# Patient Record
Sex: Female | Born: 1940 | Race: White | Hispanic: No | Marital: Single | Smoking: Never smoker
Health system: Southern US, Community
[De-identification: ages and names within clinical notes are randomized; demographics above are authoritative.]

## PROBLEM LIST (undated history)

## (undated) DIAGNOSIS — I10 Essential (primary) hypertension: Secondary | ICD-10-CM

## (undated) DIAGNOSIS — E039 Hypothyroidism, unspecified: Secondary | ICD-10-CM

## (undated) HISTORY — DX: Essential (primary) hypertension: I10

## (undated) HISTORY — PX: CATARACT EXTRACTION: SUR2

## (undated) HISTORY — PX: APPENDECTOMY: SHX54

## (undated) HISTORY — PX: BACK SURGERY: SHX140

## (undated) HISTORY — PX: TONSILECTOMY, ADENOIDECTOMY, BILATERAL MYRINGOTOMY AND TUBES: SHX2538

---

## 1999-07-21 ENCOUNTER — Other Ambulatory Visit: Admission: RE | Admit: 1999-07-21 | Discharge: 1999-07-21 | Payer: Self-pay | Admitting: *Deleted

## 2000-07-31 ENCOUNTER — Other Ambulatory Visit: Admission: RE | Admit: 2000-07-31 | Discharge: 2000-07-31 | Payer: Self-pay | Admitting: *Deleted

## 2001-08-01 ENCOUNTER — Other Ambulatory Visit: Admission: RE | Admit: 2001-08-01 | Discharge: 2001-08-01 | Payer: Self-pay | Admitting: *Deleted

## 2002-08-10 ENCOUNTER — Other Ambulatory Visit: Admission: RE | Admit: 2002-08-10 | Discharge: 2002-08-10 | Payer: Self-pay | Admitting: *Deleted

## 2003-09-24 ENCOUNTER — Other Ambulatory Visit: Admission: RE | Admit: 2003-09-24 | Discharge: 2003-09-24 | Payer: Self-pay | Admitting: Obstetrics and Gynecology

## 2004-10-30 ENCOUNTER — Other Ambulatory Visit: Admission: RE | Admit: 2004-10-30 | Discharge: 2004-10-30 | Payer: Self-pay | Admitting: Obstetrics and Gynecology

## 2007-07-02 ENCOUNTER — Ambulatory Visit (HOSPITAL_COMMUNITY): Admission: RE | Admit: 2007-07-02 | Discharge: 2007-07-02 | Payer: Self-pay | Admitting: Obstetrics and Gynecology

## 2007-07-17 ENCOUNTER — Encounter: Admission: RE | Admit: 2007-07-17 | Discharge: 2007-07-17 | Payer: Self-pay | Admitting: Obstetrics and Gynecology

## 2007-11-18 ENCOUNTER — Inpatient Hospital Stay (HOSPITAL_COMMUNITY): Admission: RE | Admit: 2007-11-18 | Discharge: 2007-11-19 | Payer: Self-pay | Admitting: Neurological Surgery

## 2010-06-27 NOTE — H&P (Signed)
Christine Harrell, Christine Harrell                 ACCOUNT NO.:  [id number]   MEDICAL RECORD NO.:  [id number]           PATIENT TYPE:   LOCATION:                                 FACILITY:   PHYSICIAN:  Duke Salvia. Marcelle Overlie, M.D.DATE OF BIRTH:  Jun 06, 1940   DATE OF ADMISSION:  DATE OF DISCHARGE:                              HISTORY & PHYSICAL   DATE OF SURGERY:  Jul 02, 2007, at [facility].   CHIEF COMPLAINT:  Stress urinary incontinence.   HISTORY OF PRESENT ILLNESS:  A 70 year old postmenopausal patient, who  presents with classic symptoms of SUI.  Evaluation in our office  starting last fall for incontinence symptoms revealed that she had been  on oxybutynin from her local gynecologist at home, but failed to have  significant improvement in her incontinence episodes.  In March 2009,  she had urodynamics in our office.  PVR was normal at 2 mL.  LPP ranged  from 68-114 with an MUCP of 44 and 43.  She did leak at full capacity  and no uninhibited contractions were noted.  She presents now for TOT of  this procedure including risk of bleeding, infection, adjacent organ  injury, the possible need for catheterization, the small percent chance  of mesh erosion were all reviewed with her, which she understands and  accepts.   PAST MEDICAL HISTORY:  Her current medications include Levoxyl,  diltiazem 240 mg daily, citalopram 10 mg daily, omeprazole 20 mg daily,  oxybutynin 2 mg daily, Veramyst, and Estrace vaginal cream as needed  along with vitamins and calcium supplements.  She has had two vaginal  deliveries in 1964 and 1961.   ALLERGIES:  None   REVIEW OF SYSTEMS:  Significant for history of low thyroid and  hypertension.   PHYSICAL EXAMINATION:  VITAL SIGNS:  Blood pressure 130/70 and afebrile.  HEENT:  Unremarkable.  NECK:  Supple without masses.  LUNGS:  Clear.  CARDIOVASCULAR:  Regular rate and rhythm without murmurs, rubs, or  gallops.  BREASTS:  Without masses.  ABDOMEN:  Soft,  flat, and nontender.  PELVIC:  Normal external genitalia.  Vagina and cervix clear.  Uterus  mid-positional size.  Adnexa negative.  EXTREMITIES/NEUROLOGIC:  Unremarkable.   IMPRESSION:  Stress urinary incontinence   PLAN:  TOT, the procedure and risks were reviewed as above.      Richard M. Marcelle Overlie, M.D.  Electronically Signed     RMH/MEDQ  D:  06/30/2007  T:  07/01/2007  Job:  [id number]

## 2010-06-27 NOTE — Op Note (Signed)
Christine Harrell, Christine Harrell                 ACCOUNT NO.:  [id number]   MEDICAL RECORD NO.:  [id number]          PATIENT TYPE:  INP   LOCATION:  3534                         FACILITY:  [facility]   PHYSICIAN:  Stefani Dama, M.D.  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   OPERATION:  Microdiskectomy L5-S1 left with operating microscope  microdissection technique.   SURGEON:  Stefani Dama, MD   FIRST ASSISTANT:  Hilda Lias, MD   ANESTHESIA:  General endotracheal.   INDICATIONS:  Christine Harrell is a 70 year old individual who has had  significant problems with back and left lower extremity pain.  He has  had an MRI that demonstrates herniated nucleus pulposus at L5-S1 and  having gone through a period of time where things have not been  improving despite efforts at conservative management.  She has been  advised regarding surgical intervention for this process.   PROCEDURE:  The patient was brought to the operating room supine on the  stretcher.  She was placed under general endotracheal anesthesia, then  turned prone.  The back was prepped with alcohol and DuraPrep and draped  in sterile fashion.  Midline incision was created in the lower lumbar  spine after localizing L5-S1 space with the needle and radiography and  then interspinous fascia and tissue was dissected from L5-S1.  Self-  retaining retractor was placed in the wound.  The lamina of L5 was then  cleaned and partially removed out to the medial wall of the facet and  this was done with a high-speed drill and 2 and 3-mm Kerrison punch.  The yellow ligament was taken up and then the dura was identified.  Just  under the region of the disk space, it  was noted to be a significant  elevation at the point which the S1 nerve root had a takeoff.  This was  found to be some fragments of disk material and when dissected with the  operating microscope and high magnification, there was found to be a  fragment of disk in this region.  This fragment was removed and then  several others were palpated and freed with dissector and ultimately  significant quantity of disk material was removed from the region just  under the disk space.  Once this was accomplished, then the nerve root  was noted to be quite free and clear; however, further exploration in  this region identified a communication with the disk space itself.  The  opening yielded some small fragments of disk and to better evacuate this  space, remnants of disk were incised with #15 blade and the disk space  was then explored using a series of curettes and rongeurs to evacuate  the contents of the disk space until no further fragmentatious material  could be found.  In the end, the area  was irrigated multiple times until  clear and no further fragments of disk were  found.  With this the microscope was removed.  Lumbodorsal fascia was  closed with #1 Vicryl in interrupted fashion, 2-0 Vicryl was used in the  subcutaneous tissues, 3-0 Vicryl was used subcuticularly.  Dry sterile  dressing was placed in the skin.  The patient tolerated the procedure  well.  Blood loss estimated was less than 50 mL.      Stefani Dama, M.D.  Electronically Signed     HJE/MEDQ  D:  11/18/2007  T:  11/19/2007  Job:  [id number]

## 2010-06-27 NOTE — Op Note (Signed)
NAMEARANDA, BIHM                 ACCOUNT NO.:  [id number]   MEDICAL RECORD NO.:  [id number]          PATIENT TYPE:  AMB   LOCATION:  SDC                           FACILITY:  [facility]   PHYSICIAN:  Duke Salvia. Marcelle Overlie, M.D.DATE OF BIRTH:  Feb 08, 1941   DATE OF PROCEDURE:  07/02/2007  DATE OF DISCHARGE:                               OPERATIVE REPORT   PREOPERATIVE DIAGNOSIS:  Stress urinary incontinence.   POSTOPERATIVE DIAGNOSIS:  Stress urinary incontinence.   PROCEDURE:  1. Transobturator tape sling.  2. Cystoscopy.   SURGEON:  Duke Salvia. Marcelle Overlie, MD   ANESTHESIA:  General endotracheal.   COMPLICATIONS:  None.   DRAINS:  Foley catheter.   BLOOD LOSS:  Minimal.   PROCEDURE AND FINDINGS:  The patient taken to the operating room after  an adequate level of general endotracheal anesthesia was obtained with  the patient's legs in stirrups.  The perineum and vagina were prepped  and draped with Betadine.  The bladder was drained with Foley catheter.  The mid-urethral area was infiltrated with 1:200,000 solution and 1%  Xylocaine.  The transobturator location was then marked by palpation at  the level of the clitoris just at the border of the inferior ramus of  the symphysis near the margin near the insertion of the adductor longus  muscle.  This was marked on each side and likewise infiltrated with  dilute Xylocaine.  A 2.5 cm vertical incision was made mid urethrally  extended minimally with sharp and blunt dissection.  The surgeon's  finger was then used to dissect until the posterior portion of the  inferior pubic ramus could be palpated on either side.  The Obtryx  transobturator halo needle was then used to make passes on either side.  Once was the needles were positioned, carefully checked to make sure  there was no buttonholing.  Catheter was removed at that point and  cystoscopy was carried out.  There was no evidence of any needle injury  to the bladder and ureteral jets  could be observed.  The scope was  removed and the bladder was deflated with catheter.  The mesh assembly  was pulled  through, appropriate tensioning was achieved, and the mesh trimmed at  the exit sites.  Dermabond used at the exit sites.  The vaginal incision  closed with interrupted 2-0 Vicryl sutures.  Excellent hemostasis at  that point.  She went to recovery room in good condition.  Clear urine  noted.      Richard M. Marcelle Overlie, M.D.  Electronically Signed     RMH/MEDQ  D:  07/02/2007  T:  07/03/2007  Job:  [id number]

## 2010-11-08 LAB — COMPREHENSIVE METABOLIC PANEL
ALT: 24
AST: 22
Albumin: 3.6
Alkaline Phosphatase: 91
BUN: 16
GFR calc non Af Amer: >60
Glucose, Bld: 78
Potassium: 4
Total Bilirubin: 0.3
Total Protein: 6.6

## 2010-11-08 LAB — CBC
MCHC: 34.2
RBC: 4.64
WBC: 6.7

## 2010-11-14 LAB — CBC
HCT: 36.6
Hemoglobin: 12.5
MCV: 90.4
Platelets: 424 — ABNORMAL HIGH
RBC: 4.05
WBC: 7.1

## 2010-11-14 LAB — BASIC METABOLIC PANEL
Calcium: 8.9
Chloride: 105
GFR calc non Af Amer: >60
Glucose, Bld: 76
Sodium: 141

## 2010-11-21 DIAGNOSIS — K3580 Unspecified acute appendicitis: Secondary | ICD-10-CM

## 2010-11-21 DIAGNOSIS — E039 Hypothyroidism, unspecified: Secondary | ICD-10-CM | POA: Insufficient documentation

## 2010-11-21 DIAGNOSIS — I10 Essential (primary) hypertension: Secondary | ICD-10-CM | POA: Insufficient documentation

## 2010-11-21 HISTORY — DX: Unspecified acute appendicitis: K35.80

## 2013-12-16 DIAGNOSIS — N3281 Overactive bladder: Secondary | ICD-10-CM | POA: Insufficient documentation

## 2013-12-16 DIAGNOSIS — E785 Hyperlipidemia, unspecified: Secondary | ICD-10-CM | POA: Insufficient documentation

## 2014-01-13 DIAGNOSIS — J3801 Paralysis of vocal cords and larynx, unilateral: Secondary | ICD-10-CM | POA: Insufficient documentation

## 2014-06-10 DIAGNOSIS — J309 Allergic rhinitis, unspecified: Secondary | ICD-10-CM | POA: Insufficient documentation

## 2015-01-05 ENCOUNTER — Other Ambulatory Visit: Payer: Self-pay | Admitting: Obstetrics and Gynecology

## 2015-01-05 DIAGNOSIS — R928 Other abnormal and inconclusive findings on diagnostic imaging of breast: Secondary | ICD-10-CM

## 2015-01-13 ENCOUNTER — Ambulatory Visit
Admission: RE | Admit: 2015-01-13 | Discharge: 2015-01-13 | Disposition: A | Discharge status: Home / Self Care | Payer: Medicare Other | Source: Ambulatory Visit | Attending: Obstetrics and Gynecology | Admitting: Obstetrics and Gynecology

## 2015-01-13 DIAGNOSIS — R928 Other abnormal and inconclusive findings on diagnostic imaging of breast: Secondary | ICD-10-CM

## 2015-06-24 ENCOUNTER — Other Ambulatory Visit: Payer: Self-pay | Admitting: Obstetrics and Gynecology

## 2015-06-24 DIAGNOSIS — R921 Mammographic calcification found on diagnostic imaging of breast: Secondary | ICD-10-CM

## 2015-07-18 ENCOUNTER — Other Ambulatory Visit: Payer: Self-pay | Admitting: Obstetrics and Gynecology

## 2015-07-18 ENCOUNTER — Ambulatory Visit
Admission: RE | Admit: 2015-07-18 | Discharge: 2015-07-18 | Disposition: A | Discharge status: Home / Self Care | Payer: Medicare Other | Source: Ambulatory Visit | Attending: Obstetrics and Gynecology | Admitting: Obstetrics and Gynecology

## 2015-07-18 DIAGNOSIS — R921 Mammographic calcification found on diagnostic imaging of breast: Secondary | ICD-10-CM

## 2018-02-24 DIAGNOSIS — F325 Major depressive disorder, single episode, in full remission: Secondary | ICD-10-CM | POA: Insufficient documentation

## 2018-07-09 DIAGNOSIS — F419 Anxiety disorder, unspecified: Secondary | ICD-10-CM | POA: Insufficient documentation

## 2018-07-14 ENCOUNTER — Other Ambulatory Visit: Payer: Self-pay | Admitting: Obstetrics and Gynecology

## 2018-07-14 DIAGNOSIS — R928 Other abnormal and inconclusive findings on diagnostic imaging of breast: Secondary | ICD-10-CM

## 2018-07-21 ENCOUNTER — Other Ambulatory Visit: Payer: Self-pay

## 2018-07-21 ENCOUNTER — Ambulatory Visit
Admission: RE | Admit: 2018-07-21 | Discharge: 2018-07-21 | Disposition: A | Discharge status: Home / Self Care | Payer: Medicare Other | Source: Ambulatory Visit | Attending: Obstetrics and Gynecology | Admitting: Obstetrics and Gynecology

## 2018-07-21 DIAGNOSIS — R928 Other abnormal and inconclusive findings on diagnostic imaging of breast: Secondary | ICD-10-CM

## 2020-04-26 ENCOUNTER — Encounter (INDEPENDENT_AMBULATORY_CARE_PROVIDER_SITE_OTHER): Payer: Self-pay | Admitting: Ophthalmology

## 2020-04-26 ENCOUNTER — Ambulatory Visit (INDEPENDENT_AMBULATORY_CARE_PROVIDER_SITE_OTHER): Payer: Medicare PPO | Admitting: Ophthalmology

## 2020-04-26 ENCOUNTER — Other Ambulatory Visit: Payer: Self-pay

## 2020-04-26 DIAGNOSIS — I10 Essential (primary) hypertension: Secondary | ICD-10-CM

## 2020-04-26 DIAGNOSIS — Z961 Presence of intraocular lens: Secondary | ICD-10-CM

## 2020-04-26 DIAGNOSIS — H35033 Hypertensive retinopathy, bilateral: Secondary | ICD-10-CM | POA: Diagnosis not present

## 2020-04-26 DIAGNOSIS — H43811 Vitreous degeneration, right eye: Secondary | ICD-10-CM | POA: Diagnosis not present

## 2020-04-26 DIAGNOSIS — H3581 Retinal edema: Secondary | ICD-10-CM | POA: Diagnosis not present

## 2020-04-26 NOTE — Progress Notes (Signed)
[facility] Note  04/26/2020     CHIEF COMPLAINT Patient presents for Retina Evaluation   HISTORY OF PRESENT ILLNESS: Christine Harrell is a 80 y.o. female who presents to the clinic today for:   HPI    Retina Evaluation    In both eyes.  This started 3 days ago.  Associated Symptoms Flashes and Floaters.  I, the attending physician,  performed the HPI with the patient and updated documentation appropriately.          Comments    Patient here for retina evaluation. Referred by Dr Midge Aver. Patient states vision still has floaters. More like bubble now. No eye pain. Dr. Katy Fitch didn't see any tears, but thought there was a clot. Over weekend kept seeing flashes even last night. Doesn't see flashes during when eye are closed.        Last edited by Bernarda Caffey, MD on 04/27/2020  1:13 PM. (History)    pt is here on the referral of Dr. Shirleen Schirmer for concern of PVD / retinal tear, pt states Friday she started to notice several fol in her right eye temporal vision, she states they lasted all weekend, and then on Sunday she noticed a bunch of new floaters in her vision, pt states the fol have decreased in frequency, but she is still seeing the floaters, pt has had cataract sx OU with Dr. Shirleen Schirmer  Referring physician: Warden Fillers, MD [address] [place],  [place] [phone]  HISTORICAL INFORMATION:   Selected notes from the MEDICAL RECORD NUMBER Referred by Dr. Shirleen Schirmer for concern of PVD / retinal tear LEE:  Ocular Hx- PMH-    CURRENT MEDICATIONS: No current outpatient medications on file. (Ophthalmic Drugs)   No current facility-administered medications for this visit. (Ophthalmic Drugs)   No current outpatient medications on file. (Other)   No current facility-administered medications for this visit. (Other)      REVIEW OF SYSTEMS: ROS    Positive for: Eyes   Negative for: Constitutional, Gastrointestinal, Neurological, Skin,  Genitourinary, Musculoskeletal, HENT, Endocrine, Cardiovascular, Respiratory, Psychiatric, Allergic/Imm, Heme/Lymph   Last edited by Matthew Folks, COA on 04/26/2020 10:18 AM. (History)       ALLERGIES Not on File  PAST MEDICAL HISTORY History reviewed. No pertinent past medical history. Past Surgical History:  Procedure Laterality Date   CATARACT EXTRACTION      FAMILY HISTORY Family History  Problem Relation Age of Onset   Breast cancer Mother     SOCIAL HISTORY         OPHTHALMIC EXAM:  Base Eye Exam    Visual Acuity (Snellen - Linear)      Right Left   Dist [place] 20/30 -2 20/20 -2   Dist ph [place] 20/25        Tonometry (Tonopen, 10:10 AM)      Right Left   Pressure 15 15       Pupils      Dark Light Shape React APD   Right 3 2 Round Brisk None   Left 3 2 Round Brisk None       Visual Fields (Counting fingers)      Left Right    Full Full       Extraocular Movement      Right Left    Full, Ortho Full, Ortho       Neuro/Psych    Oriented x3: Yes   Mood/Affect:  Normal       Dilation    Both eyes: 1.0% Mydriacyl, 2.5% Phenylephrine @ 10:10 AM        Slit Lamp and Fundus Exam    Slit Lamp Exam      Right Left   Lids/Lashes Dermatochalasis - upper lid, mild MGD Dermatochalasis - upper lid, mild MGD   Conjunctiva/Sclera White and quiet White and quiet   Cornea arcus, 2+ Punctate epithelial erosions arcus, 2+ inferior Punctate epithelial erosions   Anterior Chamber deep, narrow temporal angle, no cell deep, narrow temporal angle, no cell   Iris Round and dilated Round and dilated   Lens PC IOL in good position PC IOL in good position   Vitreous Vitreous syneresis, no pigment, Posterior vitreous detachment, blood stained vitreous condensations centrally Vitreous syneresis       Fundus Exam      Right Left   Disc Pink and Sharp, Compact, mild PPA Pink and Sharp, Compact   C/D Ratio 0.2 0.3   Macula Flat, Blunted foveal reflex, mild Drusen  along ST arcades, No heme or edema Flat, Blunted foveal reflex, mild non-central Drusen, No heme or edema   Vessels attenuated, Tortuous attenuated, Tortuous   Periphery Attached, mild midzonal and peripheral drusen, mild pre-retinal heme settled inferiorly, mild peripheral cystoid degeneration, No RT/RD on 360 scleral depression  Attached, mild midzonal and peripheral drusen, mild pre-retinal heme settled inferiorly         Refraction    Manifest Refraction      Sphere Cylinder Dist [place]   Right [place] Sphere 20/25   Left [place] Sphere 20/20          IMAGING AND PROCEDURES  Imaging and Procedures for 04/26/2020  OCT, Retina - OU - Both Eyes       Right Eye Quality was good. Central Foveal Thickness: 259. Progression has no prior data. Findings include normal foveal contour, no IRF, no SRF (+vitreous opacities).   Left Eye Quality was good. Central Foveal Thickness: 251. Progression has no prior data. Findings include normal foveal contour, no IRF, no SRF.   Notes *Images captured and stored on drive  Diagnosis / Impression:  NFP, no IRF/SRF OU OD w/ vitreous opacities  Clinical management:  See below  Abbreviations: NFP - Normal foveal profile. CME - cystoid macular edema. PED - pigment epithelial detachment. IRF - intraretinal fluid. SRF - subretinal fluid. EZ - ellipsoid zone. ERM - epiretinal membrane. ORA - outer retinal atrophy. ORT - outer retinal tubulation. SRHM - subretinal hyper-reflective material. IRHM - intraretinal hyper-reflective material                 ASSESSMENT/PLAN:    ICD-10-CM   1. Posterior vitreous detachment of right eye  H43.811   2. Retinal edema  H35.81 OCT, Retina - OU - Both Eyes  3. Essential hypertension  I10   4. Hypertensive retinopathy of both eyes  H35.033   5. Pseudophakia, both eyes  Z96.1     1,2. Hemorrhagic PVD OD  - Onset Friday, 3.11.22, w/ flashes and floaters  - presented to Dr. Shirleen Schirmer  on 3.14.22  - symptoms  decreasing and improving  - Discussed findings and prognosis  - No RT or RD on 360 scleral depressed exam  - Reviewed s/s of RT/RD  - Strict return precautions for any such RT/RD signs/symptoms  - VH precautions reviewed -- minimize activities, keep head elevated, avoid ASA/NSAIDs/blood thinners as able  - F/U 4 weeks, sooner  prn  3,4. Hypertensive retinopathy OU - discussed importance of tight BP control - monitor  5. Pseudophakia OU  - s/p CE/IOL (Dr. Shirleen Schirmer)  - IOLs in good position, doing well  - monitor    Ophthalmic Meds Ordered this visit:  No orders of the defined types were placed in this encounter.      Return in about 4 weeks (around 05/24/2020) for f/u hemorrhagic PVD OD, DFE, OCT.  There are no Patient Instructions on file for this visit.   Explained the diagnoses, plan, and follow up with the patient and they expressed understanding.  Patient expressed understanding of the importance of proper follow up care.   This document serves as a record of services personally performed by Gardiner Sleeper, MD, PhD. It was created on their behalf by San Jetty. Owens Shark, OA an ophthalmic technician. The creation of this record is the provider's dictation and/or activities during the visit.    Electronically signed by: San Jetty. Owens Shark, [place] 03.15.2022 1:18 PM   Gardiner Sleeper, M.D., Ph.D. Diseases & Surgery of the Retina and Vitreous [facility]  I have reviewed the above documentation for accuracy and completeness, and I agree with the above. Gardiner Sleeper, M.D., Ph.D. 04/27/20 1:18 PM  Abbreviations: M myopia (nearsighted); A astigmatism; H hyperopia (farsighted); P presbyopia; Mrx spectacle prescription;  CTL contact lenses; OD right eye; OS left eye; OU both eyes  XT exotropia; ET esotropia; PEK punctate epithelial keratitis; PEE punctate epithelial erosions; DES dry eye syndrome; MGD meibomian gland dysfunction; ATs artificial tears; PFAT's  preservative free artificial tears; [place] nuclear sclerotic cataract; PSC posterior subcapsular cataract; ERM epi-retinal membrane; PVD posterior vitreous detachment; RD retinal detachment; DM diabetes mellitus; DR diabetic retinopathy; NPDR non-proliferative diabetic retinopathy; PDR proliferative diabetic retinopathy; CSME clinically significant macular edema; DME diabetic macular edema; dbh dot blot hemorrhages; CWS cotton wool spot; POAG primary open angle glaucoma; C/D cup-to-disc ratio; HVF humphrey visual field; GVF goldmann visual field; OCT optical coherence tomography; IOP intraocular pressure; BRVO Branch retinal vein occlusion; CRVO central retinal vein occlusion; CRAO central retinal artery occlusion; BRAO branch retinal artery occlusion; RT retinal tear; SB scleral buckle; PPV pars plana vitrectomy; VH Vitreous hemorrhage; PRP panretinal laser photocoagulation; IVK intravitreal kenalog; VMT vitreomacular traction; MH Macular hole;  NVD neovascularization of the disc; NVE neovascularization elsewhere; AREDS age related eye disease study; ARMD age related macular degeneration; POAG primary open angle glaucoma; EBMD epithelial/anterior basement membrane dystrophy; ACIOL anterior chamber intraocular lens; IOL intraocular lens; PCIOL posterior chamber intraocular lens; Phaco/IOL phacoemulsification with intraocular lens placement; [place] photorefractive keratectomy; LASIK laser assisted in situ keratomileusis; HTN hypertension; DM diabetes mellitus; COPD chronic obstructive pulmonary disease

## 2020-04-27 ENCOUNTER — Encounter (INDEPENDENT_AMBULATORY_CARE_PROVIDER_SITE_OTHER): Payer: Self-pay | Admitting: Ophthalmology

## 2020-05-20 NOTE — Progress Notes (Signed)
[facility] Note  05/24/2020     CHIEF COMPLAINT Patient presents for Retina Follow Up   HISTORY OF PRESENT ILLNESS: Christine Harrell is a 80 y.o. female who presents to the clinic today for:   HPI    Retina Follow Up    Patient presents with  PVD.  In right eye.  This started 4 weeks ago.  I, the attending physician,  performed the HPI with the patient and updated documentation appropriately.          Comments    Pt here for retinal follow up 4wk hemorrhage PVD OD. Pt still reporting flashes on right side occurring usually late at night, when its darker in the room. Vision in OD is about the same. No reports of ocular discomfort. No floaters/flashes OS.       Last edited by Bernarda Caffey, MD on 05/24/2020  1:08 PM. (History)    pt states she rarely sees any floaters in her right eye anymore, she is still see fol, but usually only at night in a dark room  Referring physician: Warden Fillers, [address] [place],  [place] [id number]  HISTORICAL INFORMATION:   Selected notes from the [place] Referred by Dr. Shirleen Schirmer for concern of PVD / retinal tear   CURRENT MEDICATIONS: No current outpatient medications on file. (Ophthalmic Drugs)   No current facility-administered medications for this visit. (Ophthalmic Drugs)   No current outpatient medications on file. (Other)   No current facility-administered medications for this visit. (Other)      REVIEW OF SYSTEMS: ROS    Positive for: Eyes   Negative for: Constitutional, Gastrointestinal, Neurological, Skin, Genitourinary, Musculoskeletal, HENT, Endocrine, Cardiovascular, Respiratory, Psychiatric, Allergic/Imm, Heme/Lymph   Last edited by Kingsley Spittle, COT on 05/24/2020 10:03 AM. (History)       ALLERGIES Not on File  PAST MEDICAL HISTORY History reviewed. No pertinent past medical history. Past Surgical History:  Procedure Laterality Date  . CATARACT  EXTRACTION      FAMILY HISTORY Family History  Problem Relation Age of Onset  . Breast cancer Mother     SOCIAL HISTORY         OPHTHALMIC EXAM:  Base Eye Exam    Visual Acuity (Snellen - Linear)      Right Left   Dist [place] 20/25 -1 20/25 -1   Dist ph [place] 20/20 -2 20/20 -2       Tonometry (Tonopen, 10:12 AM)      Right Left   Pressure 17 13       Pupils      Dark Light Shape React APD   Right 3 2 Round Brisk None   Left 3 2 Round Brisk None       Visual Fields      Left Right    Full Full       Extraocular Movement      Right Left    Full, Ortho Full, Ortho       Neuro/Psych    Oriented x3: Yes   Mood/Affect: Normal       Dilation    Both eyes: 1.0% Mydriacyl, 2.5% Phenylephrine @ 10:13 AM        Slit Lamp and Fundus Exam    Slit Lamp Exam      Right Left   Lids/Lashes Dermatochalasis - upper lid, mild MGD Dermatochalasis - upper lid, mild MGD   Conjunctiva/Sclera White  and quiet White and quiet   Cornea arcus, 2+ Punctate epithelial erosions, EBMD arcus, 2+ inferior Punctate epithelial erosions   Anterior Chamber deep, narrow temporal angle, no cell deep, narrow temporal angle, no cell   Iris Round and dilated Round and dilated   Lens PC IOL in good position PC IOL in good position   Vitreous Vitreous syneresis, no pigment, Posterior vitreous detachment, blood stained vitreous condensations centrally improved--essentially resolved Vitreous syneresis       Fundus Exam      Right Left   Disc Pink and Sharp, Compact, mild PPA Pink and Sharp, Compact   C/D Ratio 0.2 0.3   Macula Flat, Blunted foveal reflex, mild Drusen along ST arcades, No heme or edema Flat, Blunted foveal reflex, mild non-central Drusen, No heme or edema   Vessels attenuated, Tortuous attenuated, Tortuous   Periphery Attached, mild midzonal and peripheral drusen, mild pre-retinal heme settled inferiorly -- resolved, mild peripheral cystoid degeneration, No RT/RD Attached, mild  midzonal and peripheral drusen          IMAGING AND PROCEDURES  Imaging and Procedures for 05/24/2020  OCT, Retina - OU - Both Eyes       Right Eye Quality was good. Central Foveal Thickness: 253. Progression has improved. Findings include normal foveal contour, no IRF, no SRF (Interval improvement in vitreous opacities).   Left Eye Quality was good. Central Foveal Thickness: 251. Progression has been stable. Findings include normal foveal contour, no IRF, no SRF.   Notes *Images captured and stored on drive  Diagnosis / Impression:  NFP, no IRF/SRF OU OD w/ Interval improvement in vitreous opacities   Clinical management:  See below  Abbreviations: NFP - Normal foveal profile. CME - cystoid macular edema. PED - pigment epithelial detachment. IRF - intraretinal fluid. SRF - subretinal fluid. EZ - ellipsoid zone. ERM - epiretinal membrane. ORA - outer retinal atrophy. ORT - outer retinal tubulation. SRHM - subretinal hyper-reflective material. IRHM - intraretinal hyper-reflective material                 ASSESSMENT/PLAN:    ICD-10-CM   1. Posterior vitreous detachment of right eye  H43.811   2. Retinal edema  H35.81 OCT, Retina - OU - Both Eyes  3. Essential hypertension  I10   4. Hypertensive retinopathy of both eyes  H35.033   5. Pseudophakia, both eyes  Z96.1     1,2. Hemorrhagic PVD OD  - Onset Friday, 3.11.22, w/ flashes and floaters  - presented to Dr. Shirleen Schirmer  on 3.14.22  - interval resolution of floaters; mild persistent photopsias at night  - mild VH now clear  - Discussed findings and prognosis  - No RT or RD on 360 scleral depressed exam  - Reviewed s/s of RT/RD  - Strict return precautions for any such RT/RD signs/symptoms  - pt is cleared from a retina standpoint for release to Dr. Shirleen Schirmer and resumption of primary eye care  - pt can f/u here prn  3,4. Hypertensive retinopathy OU - discussed importance of tight BP control - monitor  5.  Pseudophakia OU  - s/p CE/IOL (Dr. Shirleen Schirmer)  - IOLs in good position, doing well  - monitor   Ophthalmic Meds Ordered this visit:  No orders of the defined types were placed in this encounter.      Return if symptoms worsen or fail to improve.  There are no Patient Instructions on file for this visit.   Explained the  diagnoses, plan, and follow up with the patient and they expressed understanding.  Patient expressed understanding of the importance of proper follow up care.   This document serves as a record of services personally performed by Gardiner Sleeper, MD, PhD. It was created on their behalf by Estill Bakes, COT an ophthalmic technician. The creation of this record is the provider's dictation and/or activities during the visit.    Electronically signed by: Estill Bakes, COT 4.8.22 @ 1:11 PM   This document serves as a record of services personally performed by Gardiner Sleeper, MD, PhD. It was created on their behalf by San Jetty. Owens Shark, OA an ophthalmic technician. The creation of this record is the provider's dictation and/or activities during the visit.    Electronically signed by: San Jetty. Owens Shark, [place] 04.12.2022 1:11 PM  Gardiner Sleeper, M.D., Ph.D. Diseases & Surgery of the Retina and B and E 05/24/2020   I have reviewed the above documentation for accuracy and completeness, and I agree with the above. Gardiner Sleeper, M.D., Ph.D. 05/24/20 1:11 PM  Abbreviations: M myopia (nearsighted); A astigmatism; H hyperopia (farsighted); P presbyopia; Mrx spectacle prescription;  CTL contact lenses; OD right eye; OS left eye; OU both eyes  XT exotropia; ET esotropia; PEK punctate epithelial keratitis; PEE punctate epithelial erosions; DES dry eye syndrome; MGD meibomian gland dysfunction; ATs artificial tears; PFAT's preservative free artificial tears; [place] nuclear sclerotic cataract; PSC posterior subcapsular cataract; ERM epi-retinal membrane;  PVD posterior vitreous detachment; RD retinal detachment; DM diabetes mellitus; DR diabetic retinopathy; NPDR non-proliferative diabetic retinopathy; PDR proliferative diabetic retinopathy; CSME clinically significant macular edema; DME diabetic macular edema; dbh dot blot hemorrhages; CWS cotton wool spot; POAG primary open angle glaucoma; C/D cup-to-disc ratio; HVF humphrey visual field; GVF goldmann visual field; OCT optical coherence tomography; IOP intraocular pressure; BRVO Branch retinal vein occlusion; CRVO central retinal vein occlusion; CRAO central retinal artery occlusion; BRAO branch retinal artery occlusion; RT retinal tear; SB scleral buckle; PPV pars plana vitrectomy; VH Vitreous hemorrhage; PRP panretinal laser photocoagulation; IVK intravitreal kenalog; VMT vitreomacular traction; MH Macular hole;  NVD neovascularization of the disc; NVE neovascularization elsewhere; AREDS age related eye disease study; ARMD age related macular degeneration; POAG primary open angle glaucoma; EBMD epithelial/anterior basement membrane dystrophy; ACIOL anterior chamber intraocular lens; IOL intraocular lens; PCIOL posterior chamber intraocular lens; Phaco/IOL phacoemulsification with intraocular lens placement; [place] photorefractive keratectomy; LASIK laser assisted in situ keratomileusis; HTN hypertension; DM diabetes mellitus; COPD chronic obstructive pulmonary disease

## 2020-05-24 ENCOUNTER — Ambulatory Visit (INDEPENDENT_AMBULATORY_CARE_PROVIDER_SITE_OTHER): Payer: Medicare PPO | Admitting: Ophthalmology

## 2020-05-24 ENCOUNTER — Other Ambulatory Visit: Payer: Self-pay

## 2020-05-24 ENCOUNTER — Encounter (INDEPENDENT_AMBULATORY_CARE_PROVIDER_SITE_OTHER): Payer: Self-pay | Admitting: Ophthalmology

## 2020-05-24 DIAGNOSIS — Z961 Presence of intraocular lens: Secondary | ICD-10-CM

## 2020-05-24 DIAGNOSIS — H43811 Vitreous degeneration, right eye: Secondary | ICD-10-CM | POA: Diagnosis not present

## 2020-05-24 DIAGNOSIS — H3581 Retinal edema: Secondary | ICD-10-CM | POA: Diagnosis not present

## 2020-05-24 DIAGNOSIS — H35033 Hypertensive retinopathy, bilateral: Secondary | ICD-10-CM | POA: Diagnosis not present

## 2020-05-24 DIAGNOSIS — I10 Essential (primary) hypertension: Secondary | ICD-10-CM | POA: Diagnosis not present

## 2020-06-18 IMAGING — MG DIGITAL DIAGNOSTIC UNILATERAL RIGHT MAMMOGRAM WITH TOMO AND CAD
4 series · 4 of 12 positions shown · non-contrast
Comparison: Previous exam(s).
COMPARISON: Previous exam(s).

Addendum:
CLINICAL DATA: Possible mass in the upper-outer right breast on the
recent screening mammogram.

EXAM:
DIGITAL DIAGNOSTIC RIGHT MAMMOGRAM WITH TOMO
ULTRASOUND RIGHT BREAST

[R CC synth-2D]
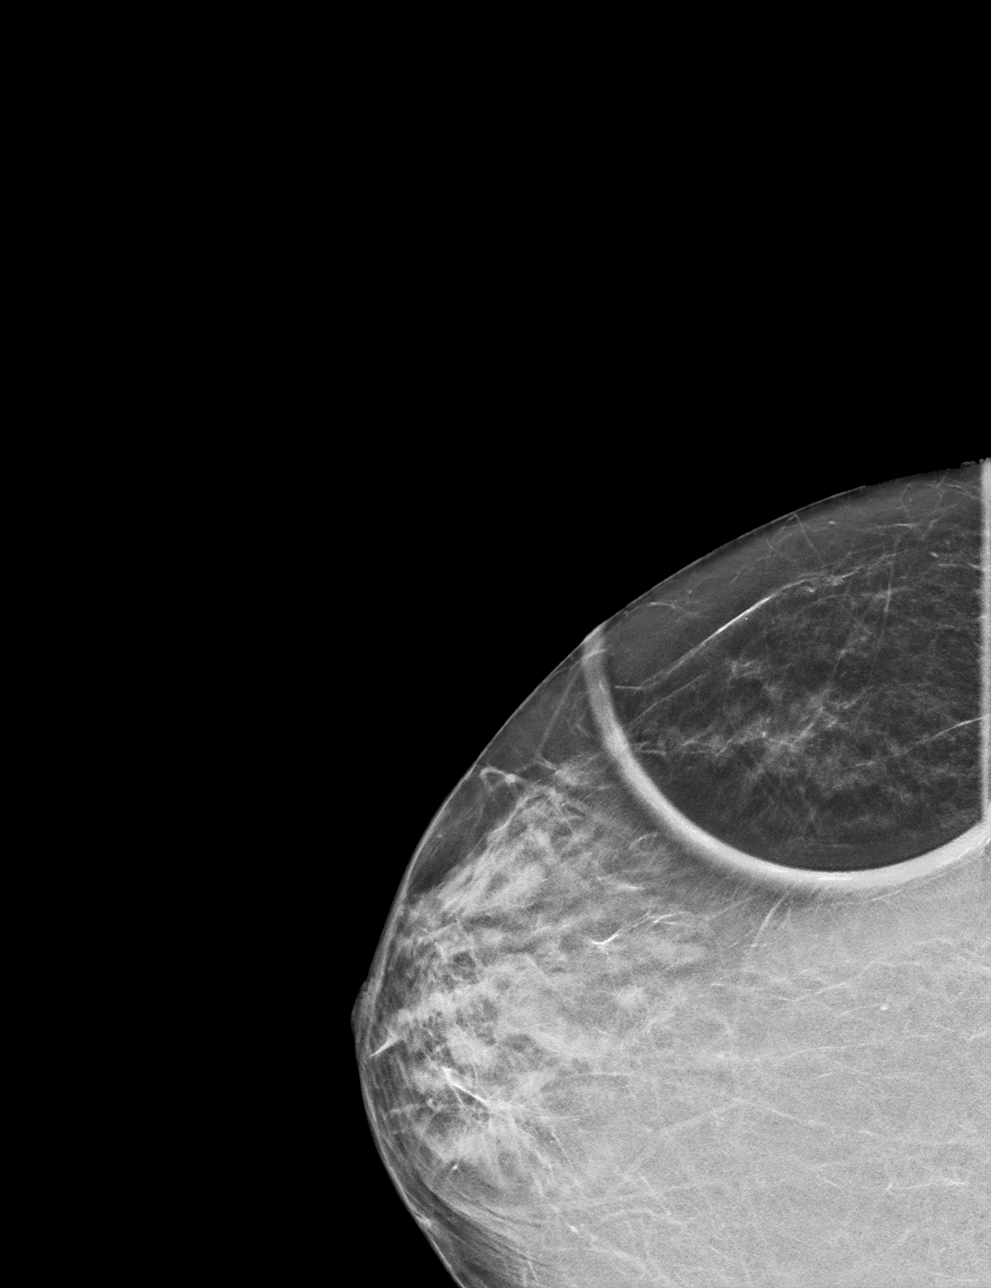

[R MLO synth-2D]
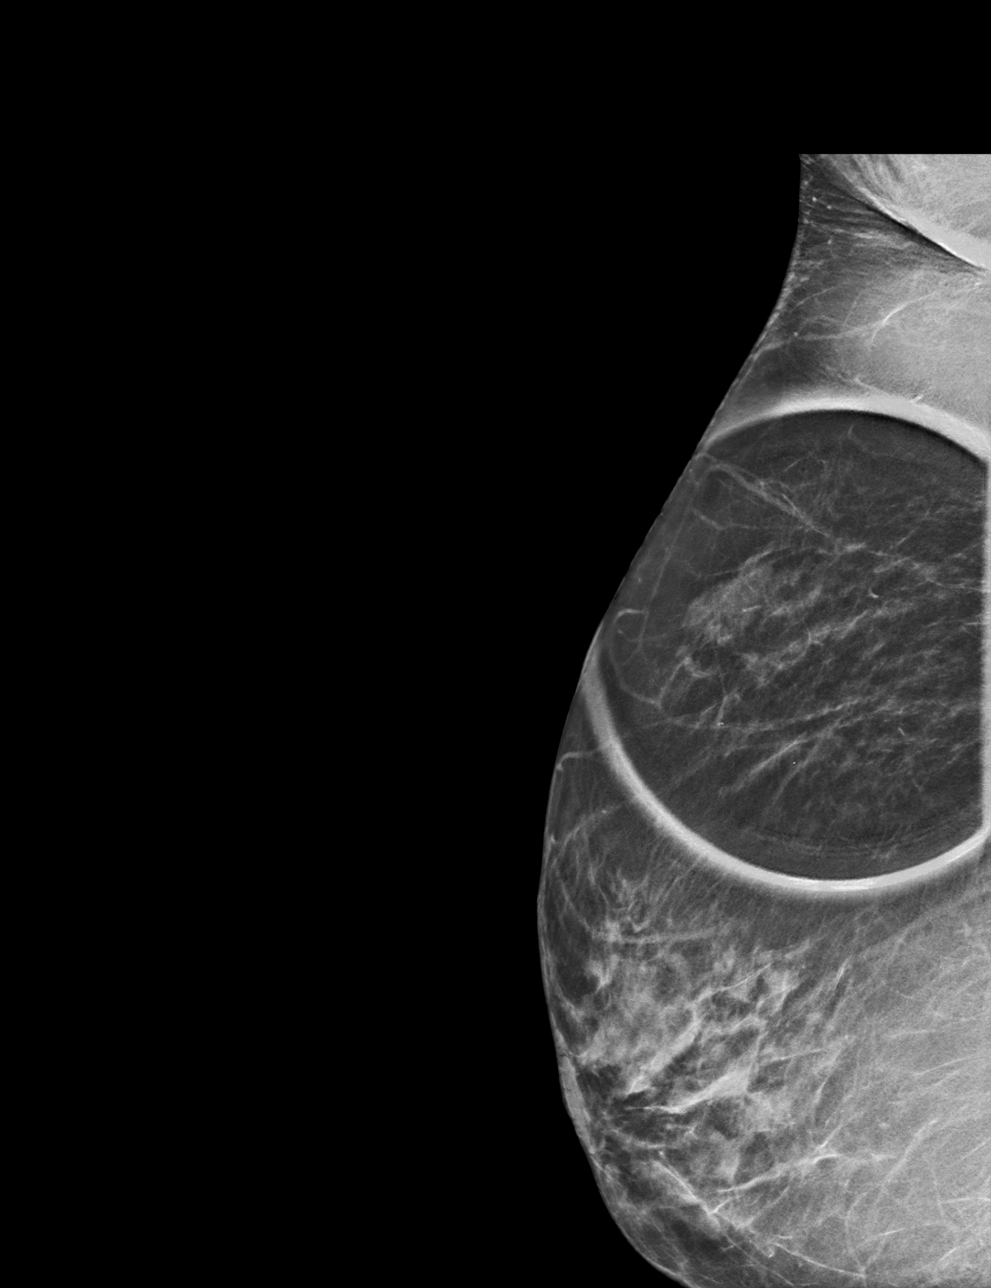

[R MLO tomo · tomo slice 27/52.0]
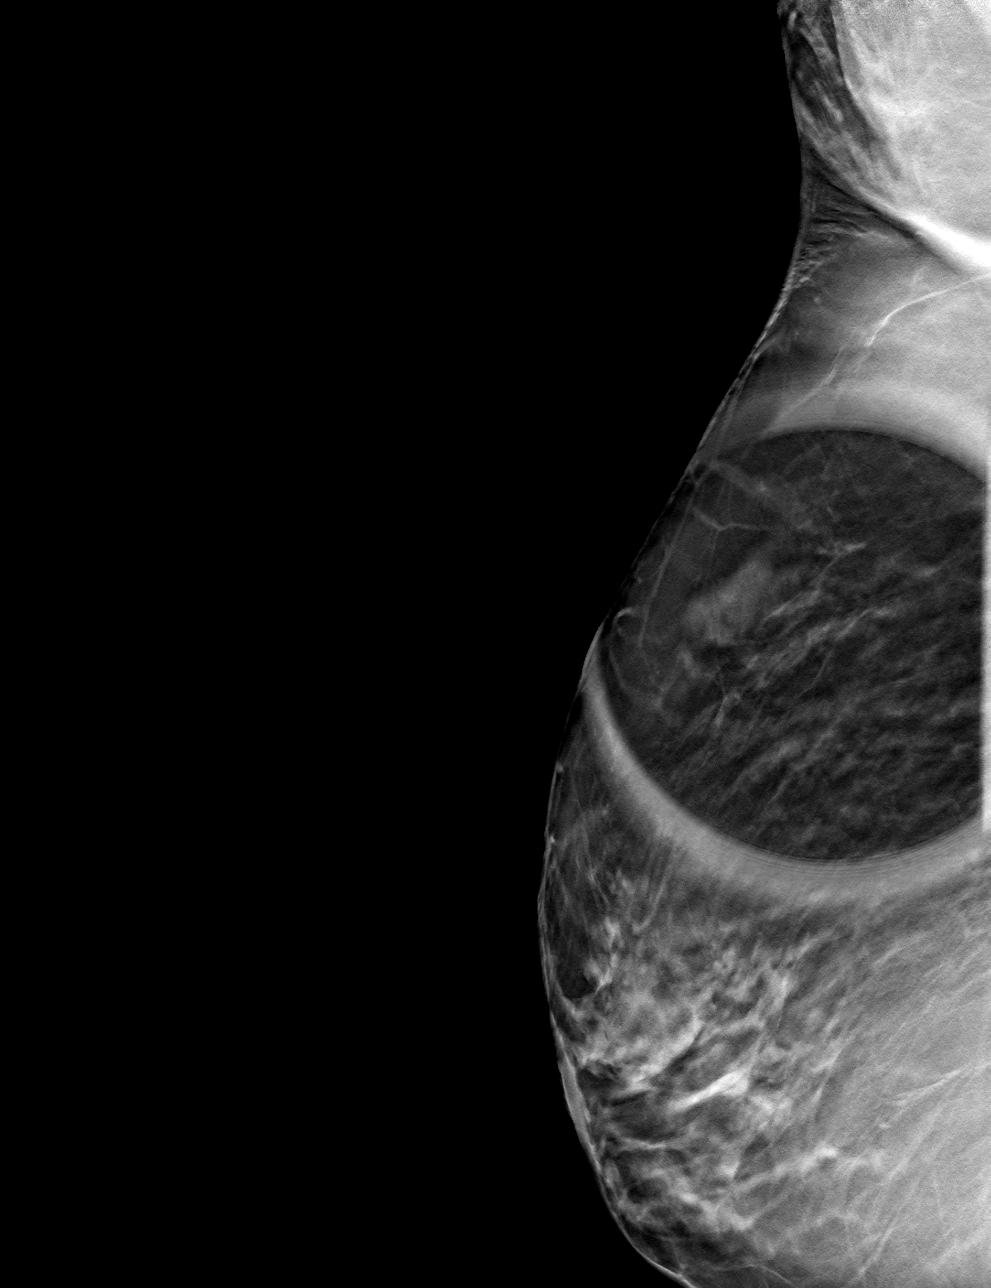

[R CC tomo · tomo slice 23/44.0]
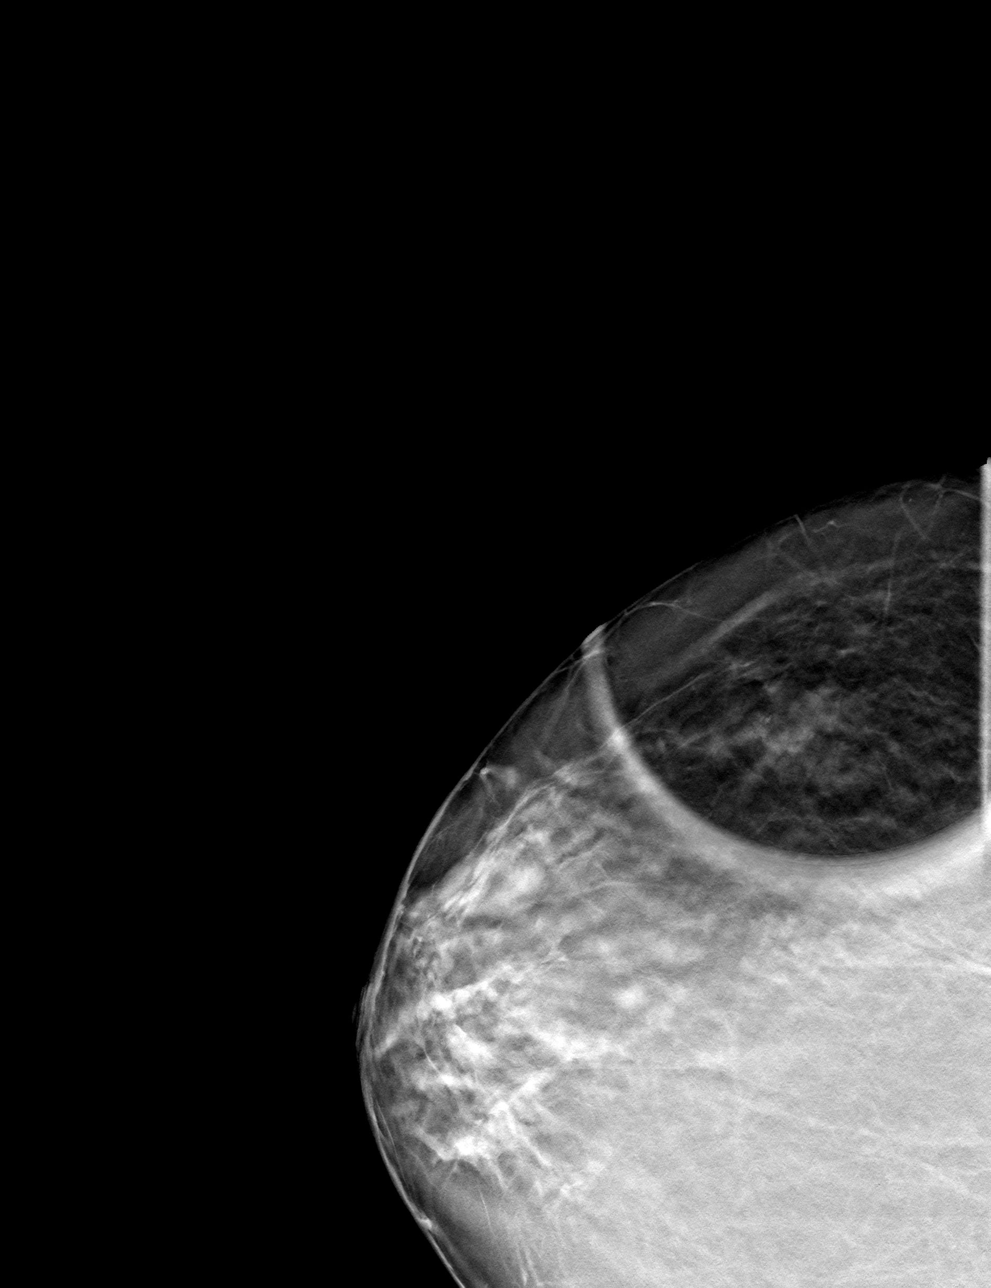

[4 of 12 positions shown; findings below may reference images not displayed]

ACR Breast Density Category b: There are scattered areas of
fibroglandular density.
FINDINGS: 3D tomographic and 2D generated spot compression views of the right
breast demonstrate an oval asymmetry with predominantly indistinct
margins in the upper-outer quadrant at the location of recent
mammographic concern. There is some interspersed fat.

On physical exam, no mass or thickening is palpable in the upper
outer right breast.

Targeted ultrasound is performed, showing an island of
fibroglandular tissue containing a 5 mm cyst in the 10 o'clock
position of the right breast, corresponding to the mammographic
asymmetry. No solid masses or other findings suspicious for
malignancy were seen.
IMPRESSION: Island of benign fibroglandular and fibrocystic tissue in the upper
outer right breast. No evidence of malignancy.

RECOMMENDATION:
Bilateral screening mammogram in year.

I have discussed the findings and recommendations with the patient.
Results were also provided in writing at the conclusion of the
visit. If applicable, a reminder letter will be sent to the patient
regarding the next appointment.

BI-RADS CATEGORY  2: Benign.

ADDENDUM:
A bilateral screening mammogram is recommended in 1 year

*** End of Addendum ***
ACR Breast Density Category b: There are scattered areas of
fibroglandular density.
FINDINGS: 3D tomographic and 2D generated spot compression views of the right
breast demonstrate an oval asymmetry with predominantly indistinct
margins in the upper-outer quadrant at the location of recent
mammographic concern. There is some interspersed fat.

On physical exam, no mass or thickening is palpable in the upper
outer right breast.

Targeted ultrasound is performed, showing an island of
fibroglandular tissue containing a 5 mm cyst in the 10 o'clock
position of the right breast, corresponding to the mammographic
asymmetry. No solid masses or other findings suspicious for
malignancy were seen.
IMPRESSION: Island of benign fibroglandular and fibrocystic tissue in the upper
outer right breast. No evidence of malignancy.

RECOMMENDATION:
Bilateral screening mammogram in year.

I have discussed the findings and recommendations with the patient.
Results were also provided in writing at the conclusion of the
visit. If applicable, a reminder letter will be sent to the patient
regarding the next appointment.

BI-RADS CATEGORY  2: Benign.

## 2020-08-04 DIAGNOSIS — M810 Age-related osteoporosis without current pathological fracture: Secondary | ICD-10-CM | POA: Insufficient documentation

## 2020-08-04 DIAGNOSIS — E669 Obesity, unspecified: Secondary | ICD-10-CM | POA: Insufficient documentation

## 2020-08-04 DIAGNOSIS — M8580 Other specified disorders of bone density and structure, unspecified site: Secondary | ICD-10-CM | POA: Insufficient documentation

## 2021-08-03 ENCOUNTER — Other Ambulatory Visit: Payer: Self-pay | Admitting: Obstetrics and Gynecology

## 2021-08-03 DIAGNOSIS — R928 Other abnormal and inconclusive findings on diagnostic imaging of breast: Secondary | ICD-10-CM

## 2021-08-08 ENCOUNTER — Ambulatory Visit
Admission: RE | Admit: 2021-08-08 | Discharge: 2021-08-08 | Disposition: A | Discharge status: Home / Self Care | Payer: Medicare PPO | Source: Ambulatory Visit | Attending: Obstetrics and Gynecology | Admitting: Obstetrics and Gynecology

## 2021-08-08 ENCOUNTER — Other Ambulatory Visit: Payer: Self-pay | Admitting: Obstetrics and Gynecology

## 2021-08-08 ENCOUNTER — Ambulatory Visit
Admission: RE | Admit: 2021-08-08 | Discharge: 2021-08-08 | Disposition: A | Discharge status: Home / Self Care | Payer: Medicare Other | Source: Ambulatory Visit | Attending: Obstetrics and Gynecology | Admitting: Obstetrics and Gynecology

## 2021-08-08 DIAGNOSIS — R928 Other abnormal and inconclusive findings on diagnostic imaging of breast: Secondary | ICD-10-CM

## 2021-08-08 DIAGNOSIS — N6320 Unspecified lump in the left breast, unspecified quadrant: Secondary | ICD-10-CM

## 2021-08-14 ENCOUNTER — Ambulatory Visit
Admission: RE | Admit: 2021-08-14 | Discharge: 2021-08-14 | Disposition: A | Discharge status: Home / Self Care | Payer: Medicare PPO | Source: Ambulatory Visit | Attending: Obstetrics and Gynecology | Admitting: Obstetrics and Gynecology

## 2021-08-14 DIAGNOSIS — N6320 Unspecified lump in the left breast, unspecified quadrant: Secondary | ICD-10-CM

## 2021-08-17 ENCOUNTER — Telehealth: Payer: Self-pay | Admitting: Hematology and Oncology

## 2021-08-17 NOTE — Telephone Encounter (Signed)
Spoke to patient to confirm afternoon clinic appointment for 7/12, packet sent via mail

## 2021-08-21 ENCOUNTER — Encounter: Payer: Self-pay | Admitting: *Deleted

## 2021-08-21 DIAGNOSIS — Z170 Estrogen receptor positive status [ER+]: Secondary | ICD-10-CM | POA: Insufficient documentation

## 2021-08-22 NOTE — Progress Notes (Signed)
Radiation Oncology         [phone] ________________________________  Multidisciplinary Breast Oncology Clinic [facility]) Initial Outpatient Consultation  Name: Christine Harrell MRN: [id number]  Date: 08/23/2021  DOB: 09-27-1940  NI:[id number], Delsa Grana, MD  Jovita Kussmaul, MD   REFERRING PHYSICIAN: Autumn Messing III, MD  DIAGNOSIS: There were no encounter diagnoses.  Stage *** Left Breast LOQ, Invasive Ductal Carcinoma, ER+ / PR+ / Her2-, Grade 2  No diagnosis found.  HISTORY OF PRESENT ILLNESS::Christine Harrell is a 81 y.o. female who is presenting to the office today for evaluation of her newly diagnosed breast cancer. She is accompanied by ***. She is doing well overall.   She had routine screening mammography on 07/31/21 showing a possible abnormality in the left breast. She underwent left breast diagnostic mammography with tomography and left breast ultrasonography at [facility] on 08/08/21 showing: an indeterminate left breast mass at the 6 o'clock position 1 cmfn, measuring 7 x 3 x 3 mm. No suspicious left axillary lymphadenopathy was appreciated.   Biopsy of the 6 o'clock left breast on 08/14/21 showed: grade 2 invasive ductal/mammary carcinoma with atypical ductal hyperplasia. Prognostic indicators significant for: estrogen receptor, 100% positive and progesterone receptor, 95% positive, both with strong staining intensity. Proliferation marker Ki67 at 10%. HER2 negative.  Menarche: *** years old Age at first live birth: *** years old GP: *** LMP: *** Contraceptive: *** HRT: ***   The patient was referred today for presentation in the multidisciplinary conference.  Radiology studies and pathology slides were presented there for review and discussion of treatment options.  A consensus was discussed regarding potential next steps.  PREVIOUS RADIATION THERAPY: {EXAM; YES/NO:19492::"No"}  PAST MEDICAL HISTORY: No past medical history on file.  PAST SURGICAL HISTORY: Past  Surgical History:  Procedure Laterality Date   CATARACT EXTRACTION      FAMILY HISTORY:  Family History  Problem Relation Age of Onset   Breast cancer Mother     SOCIAL HISTORY:  Social History   Socioeconomic History   Marital status: Single    Spouse name: Not on file   Number of children: Not on file   Years of education: Not on file   Highest education level: Not on file  Occupational History   Not on file  Tobacco Use   Smoking status: Not on file   Smokeless tobacco: Not on file  Substance and Sexual Activity   Alcohol use: Not on file   Drug use: Not on file   Sexual activity: Not on file  Other Topics Concern   Not on file  Social History Narrative   Not on file   Social Determinants of Health   Financial Resource Strain: Not on file  Food Insecurity: Not on file  Transportation Needs: Not on file  Physical Activity: Not on file  Stress: Not on file  Social Connections: Not on file    ALLERGIES: Not on File  MEDICATIONS:  No current outpatient medications on file.   No current facility-administered medications for this encounter.    REVIEW OF SYSTEMS: A 10+ POINT REVIEW OF SYSTEMS WAS OBTAINED including neurology, dermatology, psychiatry, cardiac, respiratory, lymph, extremities, GI, GU, musculoskeletal, constitutional, reproductive, HEENT. On the provided form, she reports ***. She denies *** and any other symptoms.    PHYSICAL EXAM:  vitals were not taken for this visit.  {may need to copy over vitals} Lungs are clear to auscultation bilaterally. Heart has regular rate and rhythm. No palpable  cervical, supraclavicular, or axillary adenopathy. Abdomen soft, non-tender, normal bowel sounds. Breast: *** breast with no palpable mass, nipple discharge, or bleeding. *** breast with ***.   KPS = ***  100 - Normal; no complaints; no evidence of disease. 90   - Able to carry on normal activity; minor signs or symptoms of disease. 80   - Normal activity  with effort; some signs or symptoms of disease. 29   - Cares for self; unable to carry on normal activity or to do active work. 60   - Requires occasional assistance, but is able to care for most of his personal needs. 50   - Requires considerable assistance and frequent medical care. 41   - Disabled; requires special care and assistance. 55   - Severely disabled; hospital admission is indicated although death not imminent. 75   - Very sick; hospital admission necessary; active supportive treatment necessary. 10   - Moribund; fatal processes progressing rapidly. 0     - Dead  Karnofsky DA, Abelmann [place], Craver LS and Burchenal [facility] [phone]) The use of the nitrogen mustards in the palliative treatment of carcinoma: with particular reference to bronchogenic carcinoma Cancer 1 [phone]  LABORATORY DATA:  Lab Results  Component Value Date   WBC 7.1 11/18/2007   HGB 12.5 11/18/2007   HCT 36.6 11/18/2007   MCV 90.4 11/18/2007   PLT 424 (H) 11/18/2007   Lab Results  Component Value Date   NA 141 11/18/2007   K 3.9 11/18/2007   CL 105 11/18/2007   CO2 28 11/18/2007   Lab Results  Component Value Date   ALT 24 07/01/2007   AST 22 07/01/2007   ALKPHOS 91 07/01/2007   BILITOT 0.3 07/01/2007    PULMONARY FUNCTION TEST:   Review Flowsheet        No data to display          RADIOGRAPHY: [place] LT BREAST BX W LOC DEV 1ST LESION IMG BX SPEC US GUIDE  Addendum Date: 08/17/2021   ADDENDUM REPORT: 08/17/2021 15:58 ADDENDUM: Pathology revealed Breast, LEFT, needle core biopsy, 6:00, ribbon clip - GRADE 2 INVASIVE MAMMARY CARCINOMA WITH DUCTAL AND PREDOMINANTLY LOBULAR FEATURES, ATYPICAL DUCTAL HYPERPLASIA. This was found to be concordant by Dr. Nolon Nations. Pathology results were discussed with the patient by telephone. The patient reported doing well after the biopsy with tenderness at the site. Post biopsy instructions and care were reviewed and questions were answered. The patient was  encouraged to call [facility] for any additional concerns. The patient was referred to [facility] on August 23, 2021. Pathology results reported by Stacie Acres RN on 08/17/2021. Electronically Signed   By: Nolon Nations M.D.   On: 08/17/2021 15:58   Result Date: 08/17/2021 CLINICAL DATA:  Patient presents for ultrasound-guided core biopsy of LEFT breast. EXAM: ULTRASOUND GUIDED LEFT BREAST CORE NEEDLE BIOPSY COMPARISON:  None Available. PROCEDURE: I met with the patient and we discussed the procedure of ultrasound-guided biopsy, including benefits and alternatives. We discussed the high likelihood of a successful procedure. We discussed the risks of the procedure, including infection, bleeding, tissue injury, clip migration, and inadequate sampling. Informed written consent was given. The usual time-out protocol was performed immediately prior to the procedure. Lesion quadrant: 6 o'clock LEFT breast Using sterile technique and 1% Lidocaine as local anesthetic, under direct ultrasound visualization, a 12 gauge spring-loaded device was used to perform biopsy of  mass in the 6 o'clock location of the LEFT breast 1 centimeter the nipple using a LATERAL approach. At the conclusion of the procedure ribbon shaped tissue marker clip was deployed into the biopsy cavity. Follow up 2 view mammogram was performed and dictated separately. IMPRESSION: Ultrasound guided biopsy of LEFT breast mass. No apparent complications. Electronically Signed: By: Nolon Nations M.D. On: 08/14/2021 14:15  MM CLIP PLACEMENT LEFT  Result Date: 08/14/2021 CLINICAL DATA:  Status post ultrasound-guided core biopsy of LEFT breast mass. EXAM: 3D DIAGNOSTIC LEFT MAMMOGRAM POST ULTRASOUND BIOPSY COMPARISON:  Previous exam(s). FINDINGS: 3D Mammographic images were obtained following ultrasound guided biopsy of mass in the 6 o'clock location of  the LEFT breast 1 centimeter from the nipple. The biopsy marking clip is in expected position at the site of biopsy. During the postprocedure clip images, patient slowly fell backwards, striking her bottom and softly rolling to strike the back of her head on the floor. Patient reports her legs were very weak. On physical exam, patient had no tenderness in the back of the head. I palpated no mass in this region. Patient reported no pain in the bottom or pelvis , Initially, patient had mild frontal headache after the fall but had no other symptoms. The headache quickly resolved over the next 10 minutes. Patient was discharged from the [facility] in good condition. She was given my card in case there are any changes in her condition. She was encouraged to seek further care in the emergency department as needed IMPRESSION: Appropriate positioning of the ribbon shaped biopsy marking clip at the site of biopsy in the LOWER LEFT breast. Final Assessment: Post Procedure Mammograms for Marker Placement Electronically Signed   By: Nolon Nations M.D.   On: 08/14/2021 15:51  MM DIAG BREAST TOMO UNI LEFT  Result Date: 08/08/2021 CLINICAL DATA:  81 year old female recalled from screening mammogram dated 07/31/2021 for a possible left breast mass. EXAM: DIGITAL DIAGNOSTIC UNILATERAL LEFT MAMMOGRAM WITH TOMOSYNTHESIS AND CAD; ULTRASOUND LEFT BREAST LIMITED TECHNIQUE: Left digital diagnostic mammography and breast tomosynthesis was performed. The images were evaluated with computer-aided detection.; Targeted ultrasound examination of the left breast was performed. COMPARISON:  Previous exam(s). ACR Breast Density Category b: There are scattered areas of fibroglandular density. FINDINGS: Possible mass in the inferior subareolar left breast partially effaces on the CC projection but persists on the MLO projection. Further evaluation with ultrasound was performed. Targeted ultrasound is performed, showing an irregular,  hypoechoic mass with hyperechoic rim at the 6 o'clock position 1 cm from the nipple. It measures 7 x 3 x 3 mm. There is peripheral vascularity. This may correspond with the mammographic finding. Evaluation of the left axilla demonstrates no suspicious lymphadenopathy. IMPRESSION: 1. Indeterminate left breast mass at the 6 o'clock position 1 cm from the nipple. Recommend ultrasound-guided biopsy. 2. No suspicious left axillary lymphadenopathy. RECOMMENDATION: 1. Ultrasound-guided biopsy of the left breast. 2. Recommend close to attention on post clip films to ensure mammographic correlation. I have discussed the findings and recommendations with the patient. If applicable, a reminder letter will be sent to the patient regarding the next appointment. BI-RADS CATEGORY  4: Suspicious. Electronically Signed   By: Kristopher Oppenheim M.D.   On: 08/08/2021 15:41  US BREAST LTD UNI LEFT INC AXILLA  Result Date: 08/08/2021 CLINICAL DATA:  80 year old female recalled from screening mammogram dated 07/31/2021 for a possible left breast mass. EXAM: DIGITAL DIAGNOSTIC UNILATERAL LEFT MAMMOGRAM WITH TOMOSYNTHESIS AND CAD; ULTRASOUND LEFT BREAST LIMITED TECHNIQUE: Left  digital diagnostic mammography and breast tomosynthesis was performed. The images were evaluated with computer-aided detection.; Targeted ultrasound examination of the left breast was performed. COMPARISON:  Previous exam(s). ACR Breast Density Category b: There are scattered areas of fibroglandular density. FINDINGS: Possible mass in the inferior subareolar left breast partially effaces on the CC projection but persists on the MLO projection. Further evaluation with ultrasound was performed. Targeted ultrasound is performed, showing an irregular, hypoechoic mass with hyperechoic rim at the 6 o'clock position 1 cm from the nipple. It measures 7 x 3 x 3 mm. There is peripheral vascularity. This may correspond with the mammographic finding. Evaluation of the left  axilla demonstrates no suspicious lymphadenopathy. IMPRESSION: 1. Indeterminate left breast mass at the 6 o'clock position 1 cm from the nipple. Recommend ultrasound-guided biopsy. 2. No suspicious left axillary lymphadenopathy. RECOMMENDATION: 1. Ultrasound-guided biopsy of the left breast. 2. Recommend close to attention on post clip films to ensure mammographic correlation. I have discussed the findings and recommendations with the patient. If applicable, a reminder letter will be sent to the patient regarding the next appointment. BI-RADS CATEGORY  4: Suspicious. Electronically Signed   By: Kristopher Oppenheim M.D.   On: 08/08/2021 15:41     IMPRESSION: ***   Patient will be a good candidate for breast conservation with radiotherapy to the left breast. We discussed the general course of radiation, potential side effects, and toxicities with radiation and the patient is interested in this approach. ***   PLAN:  ***    ------------------------------------------------  Blair Promise, PhD, MD  This document serves as a record of services personally performed by Gery Pray, MD. It was created on his behalf by Roney Mans, a trained medical scribe. The creation of this record is based on the scribe's personal observations and the provider's statements to them. This document has been checked and approved by the attending provider.

## 2021-08-23 ENCOUNTER — Other Ambulatory Visit: Payer: Self-pay

## 2021-08-23 ENCOUNTER — Inpatient Hospital Stay (HOSPITAL_BASED_OUTPATIENT_CLINIC_OR_DEPARTMENT_OTHER): Payer: Medicare PPO | Admitting: Genetic Counselor

## 2021-08-23 ENCOUNTER — Encounter: Payer: Self-pay | Admitting: General Practice

## 2021-08-23 ENCOUNTER — Inpatient Hospital Stay: Payer: Medicare PPO

## 2021-08-23 ENCOUNTER — Encounter: Payer: Self-pay | Admitting: *Deleted

## 2021-08-23 ENCOUNTER — Ambulatory Visit
Admission: RE | Admit: 2021-08-23 | Discharge: 2021-08-23 | Disposition: A | Discharge status: Home / Self Care | Payer: Medicare PPO | Source: Ambulatory Visit | Attending: Radiation Oncology | Admitting: Radiation Oncology

## 2021-08-23 ENCOUNTER — Ambulatory Visit: Payer: Self-pay | Admitting: General Surgery

## 2021-08-23 ENCOUNTER — Inpatient Hospital Stay: Payer: Medicare PPO | Attending: Hematology and Oncology | Admitting: Hematology and Oncology

## 2021-08-23 ENCOUNTER — Encounter: Payer: Self-pay | Admitting: Genetic Counselor

## 2021-08-23 DIAGNOSIS — Z803 Family history of malignant neoplasm of breast: Secondary | ICD-10-CM | POA: Diagnosis not present

## 2021-08-23 DIAGNOSIS — Z800 Family history of malignant neoplasm of digestive organs: Secondary | ICD-10-CM | POA: Diagnosis not present

## 2021-08-23 DIAGNOSIS — Z8041 Family history of malignant neoplasm of ovary: Secondary | ICD-10-CM | POA: Diagnosis not present

## 2021-08-23 DIAGNOSIS — Z170 Estrogen receptor positive status [ER+]: Secondary | ICD-10-CM | POA: Insufficient documentation

## 2021-08-23 DIAGNOSIS — C50512 Malignant neoplasm of lower-outer quadrant of left female breast: Secondary | ICD-10-CM

## 2021-08-23 LAB — CBC WITH DIFFERENTIAL (CANCER CENTER ONLY)
Abs Immature Granulocytes: 0.03 10*3/uL (ref 0.00–0.07)
Basophils Absolute: 0 10*3/uL (ref 0.0–0.1)
Basophils Relative: 1 %
Eosinophils Absolute: 0.3 10*3/uL (ref 0.0–0.5)
Eosinophils Relative: 4 %
HCT: 45.8 % (ref 36.0–46.0)
Hemoglobin: 15.3 g/dL — ABNORMAL HIGH (ref 12.0–15.0)
Immature Granulocytes: 1 %
Lymphocytes Relative: 23 %
Lymphs Abs: 1.4 10*3/uL (ref 0.7–4.0)
MCH: 30.7 pg (ref 26.0–34.0)
MCHC: 33.4 g/dL (ref 30.0–36.0)
MCV: 92 fL (ref 80.0–100.0)
Monocytes Absolute: 0.6 10*3/uL (ref 0.1–1.0)
Monocytes Relative: 10 %
Neutro Abs: 3.9 10*3/uL (ref 1.7–7.7)
Neutrophils Relative %: 61 %
Platelet Count: 259 10*3/uL (ref 150–400)
RBC: 4.98 MIL/uL (ref 3.87–5.11)
RDW: 13.3 % (ref 11.5–15.5)
WBC Count: 6.2 10*3/uL (ref 4.0–10.5)
nRBC: 0 % (ref 0.0–0.2)

## 2021-08-23 LAB — CMP (CANCER CENTER ONLY)
ALT: 14 U/L (ref 0–44)
AST: 15 U/L (ref 15–41)
Albumin: 4 g/dL (ref 3.5–5.0)
Alkaline Phosphatase: 78 U/L (ref 38–126)
Anion gap: 5 (ref 5–15)
BUN: 27 mg/dL — ABNORMAL HIGH (ref 8–23)
CO2: 30 mmol/L (ref 22–32)
Calcium: 9.6 mg/dL (ref 8.9–10.3)
Chloride: 105 mmol/L (ref 98–111)
Creatinine: 0.83 mg/dL (ref 0.44–1.00)
GFR, Estimated: >60 mL/min (ref >60)
Glucose, Bld: 110 mg/dL — ABNORMAL HIGH (ref 70–99)
Potassium: 4.4 mmol/L (ref 3.5–5.1)
Sodium: 140 mmol/L (ref 135–145)
Total Bilirubin: 0.5 mg/dL (ref 0.3–1.2)
Total Protein: 7 g/dL (ref 6.5–8.1)

## 2021-08-23 LAB — RESEARCH LABS

## 2021-08-23 LAB — GENETIC SCREENING ORDER

## 2021-08-23 NOTE — Assessment & Plan Note (Signed)
08/14/2021:Screening detected left breast mass at 6 o'clock position measuring 0.7 cm, axilla negative, biopsy revealed grade 2 IDC with lobular features with ADH ER 100%, PR 95%, Ki-67 10%, HER2 equivocal by IHC FISH negative  Pathology and radiology counseling: Discussed with the patient, the details of pathology including the type of breast cancer,the clinical staging, the significance of ER, PR and HER-2/neu receptors and the implications for treatment. After reviewing the pathology in detail, we proceeded to discuss the different treatment options between surgery, radiation, chemotherapy, antiestrogen therapies.  Treatment plan: 1.  Breast conserving surgery 2. +/- XRT 3.  Antiestrogen therapy Genetics will also be obtained because of her family history of breast cancer.  Return to clinic after surgery to discuss final pathology report.

## 2021-08-23 NOTE — Research (Signed)
Trial: [employer] 2021-05 - Specimen Collection Study to Evaluate Biomarkers in Subjects with Cancer    Patient Christine Harrell was identified by Dr. Lindi Adie as a potential candidate for the above listed study.  This Clinical Research Nurse met with Christine Harrell, Christine Harrell, on 08/23/21 in a manner and location that ensures patient privacy to discuss participation in the above listed research study.  Patient is Accompanied by her daughter, Christine Harrell .  A copy of the informed consent document with embedded HIPAA language was provided to the patient.  Patient reads, speaks, and understands [place].    Patient was provided with the business card of this Nurse and encouraged to contact the research team with any questions.  Patient was provided the option of taking informed consent documents home to review and was encouraged to review at their convenience with their support network, including other care providers. Patient is comfortable with making a decision regarding study participation today.  As outlined in the informed consent form, this Nurse and Christine Harrell discussed the purpose of the research study, the investigational nature of the study, study procedures and requirements for study participation, potential risks and benefits of study participation, as well as alternatives to participation. This study is not blinded. The patient understands participation is voluntary and they may withdraw from study participation at any time.  This study does not involve randomization.  This study does not involve an investigational drug or device. This study does not involve a placebo. Patient understands enrollment is pending full eligibility review.   Confidentiality and how the patient's information will be used as part of study participation were discussed.  Patient was informed there is reimbursement provided for their time and effort spent on trial participation.   All questions were answered to patient's  satisfaction.  The informed consent with embedded HIPAA language was reviewed page by page.  The patient's mental and emotional status is appropriate to provide informed consent, and the patient verbalizes an understanding of study participation.  Patient has agreed to participate in the above listed research study and has voluntarily signed the informed consent IRB approved version 26 Feb 2020, revised 13 Mar 2021 with embedded HIPPA language on 08/23/21 at 2:45PM.  The patient was provided with a copy of the signed informed consent form with embedded HIPAA language for their reference.  No study specific procedures were obtained prior to the signing of the informed consent document.  Approximately 20 minutes were spent with the patient reviewing the informed consent documents.  Patient was not requested to complete a Release of Information form.   Eligibility: Eligibility criteria reviewed with patient. This Nurse has reviewed this patient's inclusion and exclusion criteria and confirmed patient is eligible for study participation. Eligibility confirmed by treating investigator, who also agrees that patient should proceed with enrollment. Patient will continue with enrollment.  Data Collection: Interviewed patient for the following information.   Medical History:  High Blood Pressure  Yes Coronary Artery Disease No Lupus    No Rheumatoid Arthritis  No Diabetes   No      Lynch Syndrome  No  Is the patient currently taking a magnesium supplement?   No  Does the patient have a personal history of cancer (greater than 5 years ago)?  No  Does the patient have a family history of cancer in 1st or 2nd degree relatives? Yes If yes, Relationship(s) and Cancer type(s)? Mother - Breast, Ovarian and Colon. Father-Pancreatic. Niece- Breast.   Does the  patient have history of alcohol consumption? No    Does the patient have history of cigarette, cigar, pipe, or chewing tobacco use?  No   Blood  Collection: Research blood obtained by [facility] venipuncture. Patient tolerated well without any adverse events.  Gift Card: $50 gift card given to patient for her participation in this study.    Thanked patient for her participation in this study and encouraged her to call research if any questions.  Foye Spurling, BSN, RN, [place] Nurse II 08/23/2021

## 2021-08-23 NOTE — Progress Notes (Signed)
[place] NOTE  Patient Care Team: Corrington, Delsa Grana, MD as PCP - General (Family Medicine) Jovita Kussmaul, MD as Consulting Physician (General Surgery) Nicholas Lose, MD as Consulting Physician (Hematology and Oncology) Gery Pray, MD as Consulting Physician (Radiation Oncology) Mauro Kaufmann, RN as Oncology Nurse Navigator Rockwell Germany, RN as Oncology Nurse Navigator  CHIEF COMPLAINTS/PURPOSE OF CONSULTATION:  Newly diagnosed breast cancer  HISTORY OF PRESENTING ILLNESS:  Christine Harrell 81 y.o. female is here because of recent diagnosis of left breast mass Screening mammogram on 07/31/2021 detected possible left breast mass. Diagnostic mammogram on 07/31/2021 showed 1. Indeterminate left breast mass at the 6 o'clock position 1 cm from the nipple.  Biopsy revealed left breast cancer ER 100% positive; PR 95% positive, Her2 status negative; Grade 2 of 3.    I reviewed her records extensively and collaborated the history with the patient.  SUMMARY OF ONCOLOGIC HISTORY: Oncology History  Malignant neoplasm of lower-outer quadrant of left breast of female, estrogen receptor positive ([place])  08/14/2021 Initial Diagnosis   Screening detected left breast mass at 6 o'clock position measuring 0.7 cm, axilla negative, biopsy revealed grade 2 IDC with lobular features with ADH ER 100%, PR 95%, Ki-67 10%, HER2 equivocal by IHC FISH negative   08/23/2021 Cancer Staging   Staging form: Breast, AJCC 8th Edition - Clinical: Stage IA (cT1b, cN0, cM0, G2, ER+, PR+, HER2-) - Signed by Nicholas Lose, MD on 08/23/2021 Stage prefix: Initial diagnosis Histologic grading system: 3 grade system      MEDICAL HISTORY:  Past Medical History:  Diagnosis Date   Hypertension     SURGICAL HISTORY: Past Surgical History:  Procedure Laterality Date   APPENDECTOMY     CATARACT EXTRACTION     TONSILECTOMY, ADENOIDECTOMY, BILATERAL MYRINGOTOMY AND TUBES      SOCIAL  HISTORY: Social History   Socioeconomic History   Marital status: Single    Spouse name: Not on file   Number of children: Not on file   Years of education: Not on file   Highest education level: Not on file  Occupational History   Not on file  Tobacco Use   Smoking status: Never   Smokeless tobacco: Not on file  Substance and Sexual Activity   Alcohol use: Not Currently   Drug use: Never   Sexual activity: Not on file  Other Topics Concern   Not on file  Social History Narrative   Not on file   Social Determinants of Health   Financial Resource Strain: Not on file  Food Insecurity: Not on file  Transportation Needs: Not on file  Physical Activity: Not on file  Stress: Not on file  Social Connections: Not on file  Intimate Partner Violence: Not on file    FAMILY HISTORY: Family History  Problem Relation Age of Onset   Breast cancer Mother 59   Colon cancer Mother 39   Ovarian cancer Mother 51   Pancreatic cancer Father 64   Breast cancer Niece 5 - 12    ALLERGIES:  has No Known Allergies.  MEDICATIONS:  Current Outpatient Medications  Medication Sig Dispense Refill   Calcium Carb-Cholecalciferol (CALCIUM 600 + D PO) Take 1 tablet by mouth daily.     cholecalciferol (VITAMIN D3) 25 MCG (1000 UNIT) tablet Take 1,000 Units by mouth daily.     amLODipine (NORVASC) 5 MG tablet Take 5 mg by mouth daily.     citalopram (CELEXA) 20 MG tablet Take  20 mg by mouth every morning.     levothyroxine (SYNTHROID) 75 MCG tablet Take 75 mcg by mouth daily.     pravastatin (PRAVACHOL) 40 MG tablet Take 40 mg by mouth daily.     solifenacin (VESICARE) 5 MG tablet Take 5 mg by mouth at bedtime.     No current facility-administered medications for this visit.    REVIEW OF SYSTEMS:   Constitutional: Denies fevers, chills or abnormal night sweats Breast:  Denies any palpable lumps or discharge All other systems were reviewed with the patient and are negative.  PHYSICAL  EXAMINATION: ECOG PERFORMANCE STATUS: 1 - Symptomatic but completely ambulatory  Vitals:   08/23/21 1244  BP: (!) 155/55  Pulse: 78  Resp: 18  Temp: (!) 97.2 F (36.2 C)  SpO2: 96%   Filed Weights   08/23/21 1244  Weight: 181 lb 6.4 oz (82.3 kg)      LABORATORY DATA:  I have reviewed the data as listed Lab Results  Component Value Date   WBC 6.2 08/23/2021   HGB 15.3 (H) 08/23/2021   HCT 45.8 08/23/2021   MCV 92.0 08/23/2021   PLT 259 08/23/2021   Lab Results  Component Value Date   NA 140 08/23/2021   K 4.4 08/23/2021   CL 105 08/23/2021   CO2 30 08/23/2021    RADIOGRAPHIC STUDIES: I have personally reviewed the radiological reports and agreed with the findings in the report.  ASSESSMENT AND PLAN:  Malignant neoplasm of lower-outer quadrant of left breast of female, estrogen receptor positive ([place]) 08/14/2021:Screening detected left breast mass at 6 o'clock position measuring 0.7 cm, axilla negative, biopsy revealed grade 2 IDC with lobular features with ADH ER 100%, PR 95%, Ki-67 10%, HER2 equivocal by IHC FISH negative  Pathology and radiology counseling: Discussed with the patient, the details of pathology including the type of breast cancer,the clinical staging, the significance of ER, PR and HER-2/neu receptors and the implications for treatment. After reviewing the pathology in detail, we proceeded to discuss the different treatment options between surgery, radiation, chemotherapy, antiestrogen therapies.  Treatment plan: 1.  Breast conserving surgery 2. +/- XRT 3.  Antiestrogen therapy Genetics will also be obtained because of her family history of breast cancer.  Return to clinic after surgery to discuss final pathology report.   All questions were answered. The patient knows to call the clinic with any problems, questions or concerns.    Harriette Ohara, MD 08/23/21  I Gardiner Coins am scribing for Dr. Lindi Adie  I have reviewed the above  documentation for accuracy and completeness, and I agree with the above.

## 2021-08-23 NOTE — Research (Signed)
[employer] 2021-05 - Specimen Collection Study to Evaluate Biomarkers in Subjects with Cancer    This Nurse has reviewed this patient's inclusion and exclusion criteria as a second review and confirms Christine Harrell is eligible for study participation.  Patient may continue with enrollment.  Marjie Skiff Lonna Rabold, RN, BSN, [facility]  [facility]  [facility] Clinical Research Nurse [facility] Direct Dial [phone]  Pager [phone] 08/23/2021 2:56 PM

## 2021-08-23 NOTE — Progress Notes (Signed)
REFERRING PROVIDER: Nicholas Lose, MD  PRIMARY PROVIDER:  Curly Rim, MD  PRIMARY REASON FOR VISIT:  Encounter Diagnoses  Name Primary?   Malignant neoplasm of lower-outer quadrant of left breast of female, estrogen receptor positive ([place]) Yes   Family history of breast cancer    Family history of colon cancer    Family history of ovarian cancer     HISTORY OF PRESENT ILLNESS:   Christine Harrell, a 81 y.o. female, was seen for a [facility] cancer genetics consultation at the request of Dr. Lindi Adie due to a personal and family history of cancer.  Christine Harrell presents to clinic today to discuss the possibility of a hereditary predisposition to cancer, to discuss genetic testing, and to further clarify her future cancer risks, as well as potential cancer risks for family members.   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    CANCER HISTORY:  Oncology History  Malignant neoplasm of lower-outer quadrant of left breast of female, estrogen receptor positive ([place])  08/14/2021 Initial Diagnosis   Screening detected left breast mass at 6 o'clock position measuring 0.7 cm, axilla negative, biopsy revealed grade 2 IDC with lobular features with ADH ER 100%, PR 95%, Ki-67 10%, HER2 equivocal by IHC FISH negative   08/23/2021 Cancer Staging   Staging form: Breast, AJCC 8th Edition - Clinical: Stage IA (cT1b, cN0, cM0, G2, ER+, PR+, HER2-) - Signed by Nicholas Lose, MD on 08/23/2021 Stage prefix: Initial diagnosis Histologic grading system: 3 grade system      RISK FACTORS:  Menarche was at age 38.  First live birth at age 39.  OCP use for approximately  10+  years.  Ovaries intact: yes.  Uterus intact: yes.  Menopausal status: postmenopausal.  HRT use:  "several"  years. Colonoscopy: yes Mammogram within the last year: yes. Any excessive radiation exposure in the past: no  No past medical history on  file.  Past Surgical History:  Procedure Laterality Date   CATARACT EXTRACTION      Social History   Socioeconomic History   Marital status: Single    Spouse name: Not on file   Number of children: Not on file   Years of education: Not on file   Highest education level: Not on file  Occupational History   Not on file  Tobacco Use   Smoking status: Not on file   Smokeless tobacco: Not on file  Substance and Sexual Activity   Alcohol use: Not on file   Drug use: Not on file   Sexual activity: Not on file  Other Topics Concern   Not on file  Social History Narrative   Not on file   Social Determinants of Health   Financial Resource Strain: Not on file  Food Insecurity: Not on file  Transportation Needs: Not on file  Physical Activity: Not on file  Stress: Not on file  Social Connections: Not on file     FAMILY HISTORY:  We obtained a detailed, 4-generation family history.  Significant diagnoses are listed below: Family History  Problem Relation Age of Onset   Breast cancer Mother      Christine Harrell's mother was diagnosed with breast cancer at age 76, ovarian cancer at age 65, and colon cancer at age 80, she died at age 48. Christine Harrell's niece was diagnosed with breast cancer in her 76s. Christine Harrell reports approximately 5 maternal  cousins with cancer (unknown types). Her father was diagnosed with pancreatic cancer at age 28 and died at age 4. Christine Harrell's daughter possibly had negative hereditary cancer genetic testing. There is no reported Ashkenazi Jewish ancestry.   GENETIC COUNSELING ASSESSMENT: Christine Harrell is a 81 y.o. female with a personal and family history of cancer which is somewhat suggestive of a hereditary predisposition to cancer. We, therefore, discussed and recommended the following at today's visit.   DISCUSSION: We discussed that 5 - 10% of cancer is hereditary, with most cases of breast cancer associated with BRCA1/2.  There are other genes that can be  associated with hereditary breast cancer syndromes.  We discussed that testing is beneficial for several reasons including knowing how to follow individuals after completing their treatment, identifying whether potential treatment options would be beneficial, and understanding if other family members could be at risk for cancer and allowing them to undergo genetic testing.   We reviewed the characteristics, features and inheritance patterns of hereditary cancer syndromes. We also discussed genetic testing, including the appropriate family members to test, the process of testing, insurance coverage and turn-around-time for results. We discussed the implications of a negative, positive, carrier and/or variant of uncertain significant result. We recommended Christine Harrell pursue genetic testing for a panel that includes genes associated with breast, colon, ovarian, and pancreatic cancer.   Christine Harrell elected to have [place] Panel. The CustomNext-Cancer+RNAinsight panel offered by [facility] includes sequencing and rearrangement analysis for the following 47 genes:  APC, ATM, AXIN2, BARD1, BMPR1A, BRCA1, BRCA2, BRIP1, CDH1, CDK4, CDKN2A, CHEK2, CTNNA1, DICER1, EPCAM, GREM1, HOXB13, KIT, MEN1, MLH1, MSH2, MSH3, MSH6, MUTYH, NBN, NF1, NTHL1, PALB2, PDGFRA, PMS2, POLD1, POLE, PTEN, RAD50, RAD51C, RAD51D, SDHA, SDHB, SDHC, SDHD, SMAD4, SMARCA4, STK11, TP53, TSC1, TSC2, and VHL.  RNA data is routinely analyzed for use in variant interpretation for all genes.  Based on Christine Harrell's personal and family history of cancer, she meets medical criteria for genetic testing. Despite that she meets criteria, she may still have an out of pocket cost. We discussed that if her out of pocket cost for testing is over $100, the laboratory will call and confirm whether she wants to proceed with testing.  If the out of pocket cost of testing is less than $100 she will be billed by the genetic testing laboratory.   PLAN: After  considering the risks, benefits, and limitations, Christine Harrell provided informed consent to pursue genetic testing and the blood sample was sent to [facility] for analysis of the CustomNext Panel. Results should be available within approximately 2-3 weeks' time, at which point they will be disclosed by telephone to Christine Harrell, as will any additional recommendations warranted by these results. Christine Harrell will receive a summary of her genetic counseling visit and a copy of her results once available. This information will also be available in Epic.   Christine Harrell questions were answered to her satisfaction today. Our contact information was provided should additional questions or concerns arise. Thank you for the referral and allowing [place] to share in the care of your patient.   Lucille Passy, MS, [facility] Genetic Counselor [email or web address] (P) [phone]  The patient was seen for a total of 20 minutes in face-to-face genetic counseling.  The patient brought her daughter. Drs. Lindi Adie and/or Burr Medico were available to discuss this case as needed.   _______________________________________________________________________ For Office Staff:  Number of people involved in session: 2 Was an Intern/ student involved with case: no

## 2021-08-24 ENCOUNTER — Telehealth: Payer: Self-pay | Admitting: *Deleted

## 2021-08-24 NOTE — Progress Notes (Signed)
[place] Psychosocial Distress Screening Spiritual Care  Met with Christine Harrell and her daughter Christine Harrell in [facility] to introduce Impact team/resources, reviewing distress screen per protocol.  The patient scored a 2 on the Psychosocial Distress Thermometer which indicates mild distress. Also assessed for distress and other psychosocial needs.    08/24/2021  ONCBCN DISTRESS SCREENING   Screening Type Initial Screening   Distress experienced in past week (1-10) 2   Emotional problem type Adjusting to illness   Referral to support programs Yes    Christine Harrell reports little distress, feeling well supported by her daughter and son-in-law (both dentists), and her son (a [place]) in [place].  Follow up needed: No. Christine Harrell is aware of [place] and [facility] programming, as well as ongoing chaplain availability, should need or interest arise.   [place], [place], [facility] Pager [phone] Voicemail [phone]

## 2021-08-24 NOTE — Telephone Encounter (Signed)
[id number] Data Collection:  Called patient to ask her height as that data is needed for the study but missing from her EMR.  Patient states she is 5 ft, 2 inches.  Thanked patient for her time.  Foye Spurling, BSN, RN, [place] Nurse II 08/24/2021 3:17 PM

## 2021-08-25 ENCOUNTER — Other Ambulatory Visit: Payer: Self-pay | Admitting: General Surgery

## 2021-08-25 DIAGNOSIS — Z170 Estrogen receptor positive status [ER+]: Secondary | ICD-10-CM

## 2021-08-28 ENCOUNTER — Encounter (HOSPITAL_BASED_OUTPATIENT_CLINIC_OR_DEPARTMENT_OTHER): Payer: Self-pay | Admitting: General Surgery

## 2021-08-28 ENCOUNTER — Other Ambulatory Visit: Payer: Self-pay | Admitting: *Deleted

## 2021-08-28 ENCOUNTER — Other Ambulatory Visit: Payer: Self-pay

## 2021-08-28 DIAGNOSIS — C50512 Malignant neoplasm of lower-outer quadrant of left female breast: Secondary | ICD-10-CM

## 2021-08-29 ENCOUNTER — Encounter (HOSPITAL_BASED_OUTPATIENT_CLINIC_OR_DEPARTMENT_OTHER)
Admission: RE | Admit: 2021-08-29 | Discharge: 2021-08-29 | Disposition: A | Discharge status: Home / Self Care | Payer: Medicare PPO | Source: Ambulatory Visit | Attending: General Surgery | Admitting: General Surgery

## 2021-08-29 DIAGNOSIS — Z01810 Encounter for preprocedural cardiovascular examination: Secondary | ICD-10-CM | POA: Diagnosis present

## 2021-08-29 DIAGNOSIS — I10 Essential (primary) hypertension: Secondary | ICD-10-CM | POA: Insufficient documentation

## 2021-08-29 NOTE — Progress Notes (Signed)
EKG reviewed with Dr. Lanetta Inch, will proceed with surgery as scheduled.

## 2021-08-29 NOTE — Progress Notes (Signed)

## 2021-08-31 ENCOUNTER — Telehealth: Payer: Self-pay | Admitting: *Deleted

## 2021-08-31 ENCOUNTER — Encounter: Payer: Self-pay | Admitting: *Deleted

## 2021-08-31 NOTE — Telephone Encounter (Signed)
Spoke with patient to follow up from [facility] 7/12 and assess navigation needs. Patient denies any questions or concerns at this time. Reviewed upcoming appts. Encouraged her to call should anything arise. Patient verbalized understanding.

## 2021-09-01 ENCOUNTER — Telehealth: Payer: Self-pay | Admitting: Hematology and Oncology

## 2021-09-01 ENCOUNTER — Ambulatory Visit
Admission: RE | Admit: 2021-09-01 | Discharge: 2021-09-01 | Disposition: A | Discharge status: Home / Self Care | Payer: Medicare PPO | Source: Ambulatory Visit | Attending: General Surgery | Admitting: General Surgery

## 2021-09-01 DIAGNOSIS — Z170 Estrogen receptor positive status [ER+]: Secondary | ICD-10-CM

## 2021-09-01 NOTE — Telephone Encounter (Signed)
.  Called patient to schedule appointment per 7/14 inbasket, patient is aware of date and time.

## 2021-09-04 ENCOUNTER — Encounter (HOSPITAL_BASED_OUTPATIENT_CLINIC_OR_DEPARTMENT_OTHER)
Admission: RE | Disposition: A | Discharge status: Home / Self Care | Payer: Self-pay | Source: Home / Self Care | Attending: General Surgery

## 2021-09-04 ENCOUNTER — Ambulatory Visit (HOSPITAL_BASED_OUTPATIENT_CLINIC_OR_DEPARTMENT_OTHER): Payer: Medicare PPO | Admitting: Anesthesiology

## 2021-09-04 ENCOUNTER — Ambulatory Visit (HOSPITAL_BASED_OUTPATIENT_CLINIC_OR_DEPARTMENT_OTHER)
Admission: RE | Admit: 2021-09-04 | Discharge: 2021-09-04 | Disposition: A | Discharge status: Home / Self Care | Payer: Medicare PPO | Attending: General Surgery | Admitting: General Surgery

## 2021-09-04 ENCOUNTER — Encounter (HOSPITAL_BASED_OUTPATIENT_CLINIC_OR_DEPARTMENT_OTHER): Payer: Self-pay | Admitting: General Surgery

## 2021-09-04 ENCOUNTER — Other Ambulatory Visit: Payer: Self-pay

## 2021-09-04 ENCOUNTER — Ambulatory Visit
Admission: RE | Admit: 2021-09-04 | Discharge: 2021-09-04 | Disposition: A | Discharge status: Home / Self Care | Payer: Medicare PPO | Source: Ambulatory Visit | Attending: General Surgery | Admitting: General Surgery

## 2021-09-04 DIAGNOSIS — Z803 Family history of malignant neoplasm of breast: Secondary | ICD-10-CM | POA: Diagnosis not present

## 2021-09-04 DIAGNOSIS — E039 Hypothyroidism, unspecified: Secondary | ICD-10-CM | POA: Insufficient documentation

## 2021-09-04 DIAGNOSIS — Z170 Estrogen receptor positive status [ER+]: Secondary | ICD-10-CM | POA: Diagnosis not present

## 2021-09-04 DIAGNOSIS — Z79899 Other long term (current) drug therapy: Secondary | ICD-10-CM | POA: Diagnosis not present

## 2021-09-04 DIAGNOSIS — I10 Essential (primary) hypertension: Secondary | ICD-10-CM | POA: Insufficient documentation

## 2021-09-04 DIAGNOSIS — N6032 Fibrosclerosis of left breast: Secondary | ICD-10-CM | POA: Diagnosis not present

## 2021-09-04 DIAGNOSIS — Z01818 Encounter for other preprocedural examination: Secondary | ICD-10-CM

## 2021-09-04 DIAGNOSIS — C50212 Malignant neoplasm of upper-inner quadrant of left female breast: Secondary | ICD-10-CM | POA: Diagnosis present

## 2021-09-04 DIAGNOSIS — C50912 Malignant neoplasm of unspecified site of left female breast: Secondary | ICD-10-CM | POA: Diagnosis not present

## 2021-09-04 HISTORY — PX: BREAST LUMPECTOMY WITH RADIOACTIVE SEED LOCALIZATION: SHX6424

## 2021-09-04 HISTORY — PX: BREAST LUMPECTOMY: SHX2

## 2021-09-04 HISTORY — DX: Hypothyroidism, unspecified: E03.9

## 2021-09-04 SURGERY — BREAST LUMPECTOMY WITH RADIOACTIVE SEED LOCALIZATION
Anesthesia: General | Site: Breast | Laterality: Left

## 2021-09-04 MED ORDER — CHLORHEXIDINE GLUCONATE CLOTH 2 % EX PADS
6.0000 | MEDICATED_PAD | Freq: Once | CUTANEOUS | Status: AC
  Administered 2021-09-04: 6 via TOPICAL

## 2021-09-04 MED ORDER — PROPOFOL 10 MG/ML IV BOLUS
INTRAVENOUS | Status: AC
  Filled 2021-09-04: qty 20

## 2021-09-04 MED ORDER — DEXAMETHASONE SODIUM PHOSPHATE 10 MG/ML IJ SOLN
INTRAMUSCULAR | Status: AC
  Filled 2021-09-04: qty 1

## 2021-09-04 MED ORDER — CEFAZOLIN SODIUM-DEXTROSE 2-4 GM/100ML-% IV SOLN
INTRAVENOUS | Status: AC
  Filled 2021-09-04: qty 100

## 2021-09-04 MED ORDER — ONDANSETRON HCL 4 MG/2ML IJ SOLN
INTRAMUSCULAR | Status: AC
  Filled 2021-09-04: qty 2

## 2021-09-04 MED ORDER — FENTANYL CITRATE (PF) 100 MCG/2ML IJ SOLN: 25.0000 ug | INTRAMUSCULAR | Status: DC | PRN

## 2021-09-04 MED ORDER — CELECOXIB 200 MG PO CAPS
ORAL_CAPSULE | ORAL | Status: AC
  Filled 2021-09-04: qty 1

## 2021-09-04 MED ORDER — ACETAMINOPHEN 500 MG PO TABS
1000.0000 mg | ORAL_TABLET | ORAL | Status: AC
  Administered 2021-09-04: 1000 mg via ORAL

## 2021-09-04 MED ORDER — EPHEDRINE 5 MG/ML INJ
INTRAVENOUS | Status: AC
  Filled 2021-09-04: qty 5

## 2021-09-04 MED ORDER — ACETAMINOPHEN 500 MG PO TABS
ORAL_TABLET | ORAL | Status: AC
Start: 2021-09-04 — End: ?
  Filled 2021-09-04: qty 2

## 2021-09-04 MED ORDER — CEFAZOLIN SODIUM-DEXTROSE 2-4 GM/100ML-% IV SOLN
2.0000 g | INTRAVENOUS | Status: AC
  Administered 2021-09-04: 2 g via INTRAVENOUS

## 2021-09-04 MED ORDER — CHLORHEXIDINE GLUCONATE CLOTH 2 % EX PADS: 6.0000 | MEDICATED_PAD | Freq: Once | CUTANEOUS | Status: DC

## 2021-09-04 MED ORDER — PROPOFOL 10 MG/ML IV BOLUS
INTRAVENOUS | Status: DC | PRN
  Administered 2021-09-04: 60 mg via INTRAVENOUS
  Administered 2021-09-04: 90 mg via INTRAVENOUS

## 2021-09-04 MED ORDER — FENTANYL CITRATE (PF) 100 MCG/2ML IJ SOLN
INTRAMUSCULAR | Status: AC
  Filled 2021-09-04: qty 2

## 2021-09-04 MED ORDER — LACTATED RINGERS IV SOLN: INTRAVENOUS | Status: DC

## 2021-09-04 MED ORDER — LIDOCAINE 2% (20 MG/ML) 5 ML SYRINGE
INTRAMUSCULAR | Status: AC
  Filled 2021-09-04: qty 5

## 2021-09-04 MED ORDER — CELECOXIB 200 MG PO CAPS
200.0000 mg | ORAL_CAPSULE | ORAL | Status: AC
  Administered 2021-09-04: 200 mg via ORAL

## 2021-09-04 MED ORDER — GABAPENTIN 300 MG PO CAPS
300.0000 mg | ORAL_CAPSULE | ORAL | Status: AC
  Administered 2021-09-04: 300 mg via ORAL

## 2021-09-04 MED ORDER — AMISULPRIDE (ANTIEMETIC) 5 MG/2ML IV SOLN: 10.0000 mg | Freq: Once | INTRAVENOUS | Status: DC | PRN

## 2021-09-04 MED ORDER — PHENYLEPHRINE 80 MCG/ML (10ML) SYRINGE FOR IV PUSH (FOR BLOOD PRESSURE SUPPORT)
PREFILLED_SYRINGE | INTRAVENOUS | Status: AC
  Filled 2021-09-04: qty 10

## 2021-09-04 MED ORDER — DEXAMETHASONE SODIUM PHOSPHATE 10 MG/ML IJ SOLN
INTRAMUSCULAR | Status: DC | PRN
  Administered 2021-09-04: 4 mg via INTRAVENOUS

## 2021-09-04 MED ORDER — GLYCOPYRROLATE 0.2 MG/ML IJ SOLN
INTRAMUSCULAR | Status: DC | PRN
  Administered 2021-09-04: .2 mg via INTRAVENOUS

## 2021-09-04 MED ORDER — BUPIVACAINE-EPINEPHRINE (PF) 0.25% -1:200000 IJ SOLN
INTRAMUSCULAR | Status: DC | PRN
  Administered 2021-09-04: 20 mL

## 2021-09-04 MED ORDER — FENTANYL CITRATE (PF) 100 MCG/2ML IJ SOLN
INTRAMUSCULAR | Status: DC | PRN
  Administered 2021-09-04: 25 ug via INTRAVENOUS
  Administered 2021-09-04: 50 ug via INTRAVENOUS

## 2021-09-04 MED ORDER — OXYCODONE HCL 5 MG PO TABS: 5.0000 mg | ORAL_TABLET | Freq: Four times a day (QID) | ORAL | 0 refills | Status: DC | PRN

## 2021-09-04 MED ORDER — EPHEDRINE SULFATE (PRESSORS) 50 MG/ML IJ SOLN
INTRAMUSCULAR | Status: DC | PRN
  Administered 2021-09-04: 5 mg via INTRAVENOUS
  Administered 2021-09-04 (×2): 10 mg via INTRAVENOUS

## 2021-09-04 MED ORDER — GABAPENTIN 300 MG PO CAPS
ORAL_CAPSULE | ORAL | Status: AC
  Filled 2021-09-04: qty 1

## 2021-09-04 MED ORDER — GLYCOPYRROLATE PF 0.2 MG/ML IJ SOSY
PREFILLED_SYRINGE | INTRAMUSCULAR | Status: AC
  Filled 2021-09-04: qty 1

## 2021-09-04 MED ORDER — PHENYLEPHRINE HCL (PRESSORS) 10 MG/ML IV SOLN
INTRAVENOUS | Status: DC | PRN
  Administered 2021-09-04 (×3): 80 ug via INTRAVENOUS
  Administered 2021-09-04: 100 ug via INTRAVENOUS

## 2021-09-04 MED ORDER — ONDANSETRON HCL 4 MG/2ML IJ SOLN: 4.0000 mg | Freq: Once | INTRAMUSCULAR | Status: DC | PRN

## 2021-09-04 MED ORDER — LIDOCAINE HCL 1 % IJ SOLN
INTRAMUSCULAR | Status: DC | PRN
  Administered 2021-09-04: 50 mg via INTRADERMAL

## 2021-09-04 MED ORDER — ONDANSETRON HCL 4 MG/2ML IJ SOLN
INTRAMUSCULAR | Status: DC | PRN
  Administered 2021-09-04: 4 mg via INTRAVENOUS

## 2021-09-04 SURGICAL SUPPLY — 42 items
ADH SKN CLS APL DERMABOND .7 (GAUZE/BANDAGES/DRESSINGS) ×1
APL PRP STRL LF DISP 70% ISPRP (MISCELLANEOUS) ×1
APPLIER CLIP 9.375 MED OPEN (MISCELLANEOUS) ×2
APR CLP MED 9.3 20 MLT OPN (MISCELLANEOUS) ×1
BLADE SURG 15 STRL LF DISP TIS (BLADE) ×1 IMPLANT
BLADE SURG 15 STRL SS (BLADE) ×2
CANISTER SUC SOCK COL 7IN (MISCELLANEOUS) ×2 IMPLANT
CANISTER SUCT 1200ML W/VALVE (MISCELLANEOUS) ×2 IMPLANT
CHLORAPREP W/TINT 26 (MISCELLANEOUS) ×2 IMPLANT
CLIP APPLIE 9.375 MED OPEN (MISCELLANEOUS) IMPLANT
COVER BACK TABLE 60X90IN (DRAPES) ×2 IMPLANT
COVER MAYO STAND STRL (DRAPES) ×2 IMPLANT
COVER PROBE W GEL 5X96 (DRAPES) ×2 IMPLANT
DERMABOND ADVANCED (GAUZE/BANDAGES/DRESSINGS) ×1
DERMABOND ADVANCED .7 DNX12 (GAUZE/BANDAGES/DRESSINGS) ×1 IMPLANT
DRAPE LAPAROSCOPIC ABDOMINAL (DRAPES) ×2 IMPLANT
DRAPE UTILITY XL STRL (DRAPES) ×2 IMPLANT
ELECT COATED BLADE 2.86 ST (ELECTRODE) ×2 IMPLANT
ELECT REM PT RETURN 9FT ADLT (ELECTROSURGICAL) ×2
ELECTRODE REM PT RTRN 9FT ADLT (ELECTROSURGICAL) ×1 IMPLANT
GLOVE BIO SURGEON STRL SZ7.5 (GLOVE) ×4 IMPLANT
GOWN STRL REUS W/ TWL LRG LVL3 (GOWN DISPOSABLE) ×2 IMPLANT
GOWN STRL REUS W/TWL LRG LVL3 (GOWN DISPOSABLE) ×4
ILLUMINATOR WAVEGUIDE N/F (MISCELLANEOUS) IMPLANT
KIT MARKER MARGIN INK (KITS) ×2 IMPLANT
LIGHT WAVEGUIDE WIDE FLAT (MISCELLANEOUS) IMPLANT
NDL HYPO 25X1 1.5 SAFETY (NEEDLE) IMPLANT
NEEDLE HYPO 25X1 1.5 SAFETY (NEEDLE) IMPLANT
NS IRRIG 1000ML POUR BTL (IV SOLUTION) IMPLANT
PACK BASIN DAY SURGERY FS (CUSTOM PROCEDURE TRAY) ×2 IMPLANT
PENCIL SMOKE EVACUATOR (MISCELLANEOUS) ×2 IMPLANT
SLEEVE SCD COMPRESS KNEE MED (STOCKING) ×2 IMPLANT
SPIKE FLUID TRANSFER (MISCELLANEOUS) IMPLANT
SPONGE T-LAP 18X18 ~~LOC~~+RFID (SPONGE) ×2 IMPLANT
SUT MON AB 4-0 PC3 18 (SUTURE) ×2 IMPLANT
SUT SILK 2 0 SH (SUTURE) IMPLANT
SUT VICRYL 3-0 CR8 SH (SUTURE) ×2 IMPLANT
SYR CONTROL 10ML LL (SYRINGE) IMPLANT
TOWEL GREEN STERILE FF (TOWEL DISPOSABLE) ×2 IMPLANT
TRAY FAXITRON CT DISP (TRAY / TRAY PROCEDURE) ×2 IMPLANT
TUBE CONNECTING 20X1/4 (TUBING) ×2 IMPLANT
YANKAUER SUCT BULB TIP NO VENT (SUCTIONS) IMPLANT

## 2021-09-04 NOTE — Anesthesia Procedure Notes (Signed)
Procedure Name: LMA Insertion Date/Time: 09/04/2021 12:08 PM  Performed by: Garrel Ridgel, CRNAPre-anesthesia Checklist: Patient identified, Emergency Drugs available, Suction available and Patient being monitored Patient Re-evaluated:Patient Re-evaluated prior to induction Oxygen Delivery Method: Circle system utilized Preoxygenation: Pre-oxygenation with 100% oxygen Induction Type: IV induction Ventilation: Mask ventilation without difficulty LMA: LMA inserted LMA Size: 4.0 Tube type: Oral Number of attempts: 1 Placement Confirmation: positive ETCO2 and breath sounds checked- equal and bilateral Tube secured with: Tape Dental Injury: Teeth and Oropharynx as per pre-operative assessment

## 2021-09-04 NOTE — Discharge Instructions (Signed)
  Post Anesthesia Home Care Instructions  Activity: Get plenty of rest for the remainder of the day. A responsible individual must stay with you for 24 hours following the procedure.  For the next 24 hours, DO NOT: -Drive a car -Paediatric nurse -Drink alcoholic beverages -Take any medication unless instructed by your physician -Make any legal decisions or sign important papers.  Meals: Start with liquid foods such as gelatin or soup. Progress to regular foods as tolerated. Avoid greasy, spicy, heavy foods. If nausea and/or vomiting occur, drink only clear liquids until the nausea and/or vomiting subsides. Call your physician if vomiting continues.  Special Instructions/Symptoms: Your throat may feel dry or sore from the anesthesia or the breathing tube placed in your throat during surgery. If this causes discomfort, gargle with warm salt water. The discomfort should disappear within 24 hours.  If you had a scopolamine patch placed behind your ear for the management of post- operative nausea and/or vomiting:  1. The medication in the patch is effective for 72 hours, after which it should be removed.  Wrap patch in a tissue and discard in the trash. Wash hands thoroughly with soap and water. 2. You may remove the patch earlier than 72 hours if you experience unpleasant side effects which may include dry mouth, dizziness or visual disturbances. 3. Avoid touching the patch. Wash your hands with soap and water after contact with the patch.  No tylenol or ibuprofen until after 4pm today if needed.

## 2021-09-04 NOTE — Interval H&P Note (Signed)
History and Physical Interval Note:  09/04/2021 11:46 AM  Christine Harrell  has presented today for surgery, with the diagnosis of LEFT BREAST CANCER.  The various methods of treatment have been discussed with the patient and family. After consideration of risks, benefits and other options for treatment, the patient has consented to  Procedure(s): LEFT BREAST RADIOACTIVE SEED LOCALIZED LUMPECTOMY (Left) as a surgical intervention.  The patient's history has been reviewed, patient examined, no change in status, stable for surgery.  I have reviewed the patient's chart and labs.  Questions were answered to the patient's satisfaction.     Autumn Messing III

## 2021-09-04 NOTE — Anesthesia Postprocedure Evaluation (Signed)
Anesthesia Post Note  Patient: Christine Harrell  Procedure(s) Performed: LEFT BREAST RADIOACTIVE SEED LOCALIZED LUMPECTOMY (Left: Breast)     Patient location during evaluation: PACU Anesthesia Type: General Level of consciousness: awake Pain management: pain level controlled Vital Signs Assessment: post-procedure vital signs reviewed and stable Respiratory status: spontaneous breathing, nonlabored ventilation, respiratory function stable and patient connected to nasal cannula oxygen Cardiovascular status: blood pressure returned to baseline and stable Postop Assessment: no apparent nausea or vomiting Anesthetic complications: no   No notable events documented.  Last Vitals:  Vitals:   09/04/21 1330 09/04/21 1355  BP: (!) 114/50 138/65  Pulse: 87 78  Resp: 15 16  Temp:  (!) 36.1 C  SpO2: 95% 94%    Last Pain:  Vitals:   09/04/21 1355  TempSrc:   PainSc: 0-No pain                 Bich Mchaney P Daysen Gundrum

## 2021-09-04 NOTE — Transfer of Care (Signed)
Immediate Anesthesia Transfer of Care Note  Patient: Christine Harrell  Procedure(s) Performed: LEFT BREAST RADIOACTIVE SEED LOCALIZED LUMPECTOMY (Left: Breast)  Patient Location: PACU  Anesthesia Type:General  Level of Consciousness: oriented, drowsy and patient cooperative  Airway & Oxygen Therapy: Patient Spontanous Breathing and Patient connected to face mask oxygen  Post-op Assessment: Report given to RN and Post -op Vital signs reviewed and stable  Post vital signs: Reviewed and stable  Last Vitals:  Vitals Value Taken Time  BP 140/56 09/04/21 1305  Temp    Pulse 97 09/04/21 1309  Resp    SpO2 98 % 09/04/21 1309  Vitals shown include unvalidated device data.  Last Pain:  Vitals:   09/04/21 0949  TempSrc: Oral  PainSc: 0-No pain      Patients Stated Pain Goal: 4 (34/74/25 [id number])  Complications: No notable events documented.

## 2021-09-04 NOTE — H&P (Signed)
REFERRING PHYSICIAN: [facility] *  PROVIDER: Landry Corporal, MD  MRN: [id number] DOB: [birth year] Subjective   Chief Complaint: Breast Cancer   History of Present Illness: Christine Harrell is a 81 y.o. female who is seen today as an office consultation for evaluation of Breast Cancer .   We are asked to see the patient in consultation by Dr. Lindi Adie to evaluate her for a new left breast cancer. The patient is an 81 year old white female who recently went for a routine screening mammogram. At that time she was found to have a 7 mm mass in the inner aspect of the left breast. The axilla looked negative. The mass was biopsied and came back as a grade 2 invasive ductal cancer that was ER and PR positive and HER2 negative with a Ki-67 of 10%. She is otherwise in pretty good health and does not smoke.  Review of Systems: A complete review of systems was obtained from the patient. I have reviewed this information and discussed as appropriate with the patient. See HPI as well for other ROS.  ROS   Medical History: Past Medical History:  Diagnosis Date  Hyperlipidemia  Hypertension  Thyroid disease   Patient Active Problem List  Diagnosis  Malignant neoplasm of upper-inner quadrant of left breast in female, estrogen receptor positive (CMS-HCC)   Past Surgical History:  Procedure Laterality Date  APPENDECTOMY  back surgery    No Known Allergies  Current Outpatient Medications on File Prior to Visit  Medication Sig Dispense Refill  amLODIPine (NORVASC) 5 MG tablet Take by mouth  citalopram (CELEXA) 20 MG tablet Take by mouth  levothyroxine (SYNTHROID) 75 MCG tablet Take by mouth  pravastatin (PRAVACHOL) 40 MG tablet Take by mouth  solifenacin (VESICARE) 5 MG tablet Take by mouth   No current facility-administered medications on file prior to visit.   Family History  Problem Relation Age of Onset  Colon cancer Mother  Breast cancer Mother  Myocardial Infarction  (Heart attack) Mother  Myocardial Infarction (Heart attack) Father    Social History   Tobacco Use  Smoking Status Never  Smokeless Tobacco Never    Social History   Socioeconomic History  Marital status: Unknown  Tobacco Use  Smoking status: Never  Smokeless tobacco: Never  Substance and Sexual Activity  Alcohol use: Never  Drug use: Never   Objective:  There were no vitals filed for this visit.  There is no height or weight on file to calculate BMI.  Physical Exam Vitals reviewed.  Constitutional:  General: She is not in acute distress. Appearance: Normal appearance.  HENT:  Head: Normocephalic and atraumatic.  Right Ear: External ear normal.  Left Ear: External ear normal.  Nose: Nose normal.  Mouth/Throat:  Mouth: Mucous membranes are moist.  Pharynx: Oropharynx is clear.  Eyes:  General: No scleral icterus. Extraocular Movements: Extraocular movements intact.  Conjunctiva/sclera: Conjunctivae normal.  Pupils: Pupils are equal, round, and reactive to light.  Cardiovascular:  Rate and Rhythm: Normal rate and regular rhythm.  Pulses: Normal pulses.  Heart sounds: Normal heart sounds.  Pulmonary:  [place]: Pulmonary effort is normal. No respiratory distress.  Breath sounds: Normal breath sounds.  Abdominal:  General: Bowel sounds are normal.  Palpations: Abdomen is soft.  Tenderness: There is no abdominal tenderness.  Musculoskeletal:  General: No swelling, tenderness or deformity. Normal range of motion.  Cervical back: Normal range of motion and neck supple.  Skin: General: Skin is warm and dry.  Coloration: Skin is not  jaundiced.  Neurological:  General: No focal deficit present.  Mental Status: She is alert and oriented to person, place, and time.  Psychiatric:  Mood and Affect: Mood normal.  Behavior: Behavior normal.    Breast: There is no palpable mass in either breast. There is no palpable axillary, supraclavicular, or cervical  lymphadenopathy.  Labs, Imaging and Diagnostic Testing:  Assessment and Plan:   Diagnoses and all orders for this visit:  Malignant neoplasm of upper-inner quadrant of left breast in female, estrogen receptor positive (CMS-HCC)    The patient appears to have a small 7 mm cancer in the medial aspect of the left breast with clinically negative nodes and all favorable markers. I have discussed with her in detail the different options for treatment and at this point she favors breast conservation which I feel is very reasonable. She will not need a node evaluation given her age and the favorable nature of the cancer. I have discussed with her in detail the risks and benefits of the operation as well as some of the technical aspects including the use of a radioactive seed for localization and she understands and wishes to proceed. She will meet with medical and radiation oncology to discuss adjuvant therapy.

## 2021-09-04 NOTE — Anesthesia Preprocedure Evaluation (Addendum)
Anesthesia Evaluation  Patient identified by MRN, date of birth, ID band Patient awake    Reviewed: Allergy & Precautions, NPO status , Patient's Chart, lab work & pertinent test results  Airway Mallampati: II  TM Distance: >3 FB Neck ROM: Full    Dental no notable dental hx.    Pulmonary neg pulmonary ROS,    Pulmonary exam normal        Cardiovascular hypertension, Pt. on medications Normal cardiovascular exam     Neuro/Psych negative neurological ROS  negative psych ROS   GI/Hepatic negative GI ROS, Neg liver ROS,   Endo/Other  Hypothyroidism   Renal/GU negative Renal ROS     Musculoskeletal negative musculoskeletal ROS (+)   Abdominal   Peds  Hematology negative hematology ROS (+)   Anesthesia Other Findings LEFT BREAST CANCER  Reproductive/Obstetrics                            Anesthesia Physical Anesthesia Plan  ASA: 2  Anesthesia Plan: General   Post-op Pain Management:    Induction: Intravenous  PONV Risk Score and Plan: 3 and Ondansetron, Dexamethasone and Treatment may vary due to age or medical condition  Airway Management Planned: LMA  Additional Equipment:   Intra-op Plan:   Post-operative Plan: Extubation in OR  Informed Consent: I have reviewed the patients History and Physical, chart, labs and discussed the procedure including the risks, benefits and alternatives for the proposed anesthesia with the patient or authorized representative who has indicated his/her understanding and acceptance.     Dental advisory given  Plan Discussed with: CRNA  Anesthesia Plan Comments:        Anesthesia Quick Evaluation

## 2021-09-04 NOTE — Op Note (Signed)
09/04/2021  12:54 PM  PATIENT:  Christine Harrell  81 y.o. female  PRE-OPERATIVE DIAGNOSIS:  LEFT BREAST CANCER  POST-OPERATIVE DIAGNOSIS:  LEFT BREAST CANCER  PROCEDURE:  Procedure(s): LEFT BREAST RADIOACTIVE SEED LOCALIZED LUMPECTOMY (Left)  SURGEON:  Surgeon(s) and Role:    * Jovita Kussmaul, MD - Primary  PHYSICIAN ASSISTANT:   ASSISTANTS: none   ANESTHESIA:   local and general  EBL:  5 mL   BLOOD ADMINISTERED:none  DRAINS: none   LOCAL MEDICATIONS USED:  MARCAINE     SPECIMEN:  Source of Specimen:  left breast tissue with additional superior margin  DISPOSITION OF SPECIMEN:  PATHOLOGY  COUNTS:  YES  TOURNIQUET:  * No tourniquets in log *  DICTATION: .Dragon Dictation  After informed consent was obtained the patient was brought to the operating room and placed in the supine position on the operating table.  After adequate induction of general anesthesia the patient's left breast was prepped with ChloraPrep, allowed to dry, and draped in usual sterile manner.  An appropriate timeout was performed.  Previously an I-125 seed was placed in the lower central left breast to mark an area of invasive breast cancer.  The neoprobe was set to I-125 in the area of radioactivity was readily identified.  The area around this was infiltrated with quarter percent Marcaine.  A curvilinear incision was then made along the lower edge of the areola of the left breast with a 15 blade knife.  The incision was carried through the skin and subcutaneous tissue sharply with the electrocautery.  Dissection was then carried towards the radioactive seed under the direction of the neoprobe.  Once I more closely approached the radioactive seed I then removed a circular portion of breast tissue sharply with the electrocautery around the radioactive seed while checking the area of radioactivity frequently.  Once the specimen was removed it was oriented with the appropriate paint colors.  A specimen radiograph  was obtained that showed the clip and seed to be within the specimen.  I did elect to take an additional superior margin and this was also marked appropriately.  The tissue was then sent to pathology for further evaluation.  Hemostasis was achieved using the Bovie electrocautery.  The cavity was marked with clips.  The wound was irrigated with saline and infiltrated with more quarter percent Marcaine.  The deep layer of the incision was then closed with layers of interrupted 3-0 Vicryl stitches.  The skin was then closed with interrupted 4-0 Monocryl subcuticular stitches.  Dermabond dressings were applied.  The patient tolerated the procedure well.  At the end of the case all needle sponge and instrument counts were correct.  The patient was then awakened and taken to recovery in stable condition.  PLAN OF CARE: Discharge to home after PACU  PATIENT DISPOSITION:  PACU - hemodynamically stable.   Delay start of Pharmacological VTE agent (>24hrs) due to surgical blood loss or risk of bleeding: not applicable

## 2021-09-05 ENCOUNTER — Encounter (HOSPITAL_BASED_OUTPATIENT_CLINIC_OR_DEPARTMENT_OTHER): Payer: Self-pay | Admitting: General Surgery

## 2021-09-05 NOTE — Addendum Note (Signed)
Addendum  created 09/05/21 1104 by Glory Buff, CRNA   Charge Capture section accepted

## 2021-09-05 NOTE — Progress Notes (Signed)
Left message stating courtesy call and if any questions or concerns please call the doctors office.  

## 2021-09-06 LAB — SURGICAL PATHOLOGY

## 2021-09-07 ENCOUNTER — Encounter: Payer: Self-pay | Admitting: *Deleted

## 2021-09-08 NOTE — Progress Notes (Signed)
Patient Care Team: Corrington, Delsa Grana, MD as PCP - General (Family Medicine) Jovita Kussmaul, MD as Consulting Physician (General Surgery) Nicholas Lose, MD as Consulting Physician (Hematology and Oncology) Gery Pray, MD as Consulting Physician (Radiation Oncology) Mauro Kaufmann, RN as Oncology Nurse Navigator Rockwell Germany, RN as Oncology Nurse Navigator  DIAGNOSIS:  Encounter Diagnosis  Name Primary?   Malignant neoplasm of lower-outer quadrant of left breast of female, estrogen receptor positive ([place])     SUMMARY OF ONCOLOGIC HISTORY: Oncology History  Malignant neoplasm of lower-outer quadrant of left breast of female, estrogen receptor positive ([place])  08/14/2021 Initial Diagnosis   Screening detected left breast mass at 6 o'clock position measuring 0.7 cm, axilla negative, biopsy revealed grade 2 IDC with lobular features with ADH ER 100%, PR 95%, Ki-67 10%, HER2 equivocal by IHC FISH negative   08/23/2021 Cancer Staging   Staging form: Breast, AJCC 8th Edition - Clinical: Stage IA (cT1b, cN0, cM0, G2, ER+, PR+, HER2-) - Signed by Nicholas Lose, MD on 08/23/2021 Stage prefix: Initial diagnosis Histologic grading system: 3 grade system   09/04/2021 Surgery   Left lumpectomy: Grade 2 IDC with lobular features, intermediate grade DCIS, 0.9 cm, margins negative, ER 100%, PR 95%, HER2 negative, Ki-67 10%     CHIEF COMPLIANT: Follow-up after surgery.  INTERVAL HISTORY: Christine Harrell is a 81 y.o. female is here because of recent diagnosis of left breast mass. She presents to the clinic today for a follow-up after surgery. She states surgery went wonderful. Denies pain. She took a tylenol the night before surgery. She had some concerns on recommendations on what she should do now.   ALLERGIES:  has No Known Allergies.  MEDICATIONS:  Current Outpatient Medications  Medication Sig Dispense Refill   letrozole (FEMARA) 2.5 MG tablet Take 1 tablet (2.5 mg total) by mouth daily.  90 tablet 3   amLODipine (NORVASC) 5 MG tablet Take 5 mg by mouth daily.     Calcium Carb-Cholecalciferol (CALCIUM 600 + D PO) Take 1 tablet by mouth daily.     cetirizine (ZYRTEC) 10 MG tablet Take by mouth.     cholecalciferol (VITAMIN D3) 25 MCG (1000 UNIT) tablet Take 1,000 Units by mouth daily.     citalopram (CELEXA) 20 MG tablet Take 20 mg by mouth every morning.     levothyroxine (SYNTHROID) 75 MCG tablet Take 75 mcg by mouth daily.     oxyCODONE (ROXICODONE) 5 MG immediate release tablet Take 1 tablet (5 mg total) by mouth every 6 (six) hours as needed for severe pain. 10 tablet 0   pravastatin (PRAVACHOL) 40 MG tablet Take 40 mg by mouth daily.     solifenacin (VESICARE) 5 MG tablet Take 5 mg by mouth at bedtime.     No current facility-administered medications for this visit.    PHYSICAL EXAMINATION: ECOG PERFORMANCE STATUS: 1 - Symptomatic but completely ambulatory  Vitals:   09/15/21 0913  BP: (!) 141/63  Pulse: (!) 56  Resp: 18  Temp: 97.7 F (36.5 C)  SpO2: 96%   Filed Weights   09/15/21 0913  Weight: 181 lb 8 oz (82.3 kg)     LABORATORY DATA:  I have reviewed the data as listed    Latest Ref Rng & Units 08/23/2021   12:25 PM 11/18/2007    2:05 PM 07/01/2007   10:28 AM  CMP  Glucose 70 - 99 mg/dL 110  76  78   BUN 8 - 23  mg/dL '27  16  16   ' Creatinine 0.44 - 1.00 mg/dL 0.83  0.69  0.61   Sodium 135 - 145 mmol/L 140  141  142   Potassium 3.5 - 5.1 mmol/L 4.4  3.9  4.0   Chloride 98 - 111 mmol/L 105  105  107   CO2 22 - 32 mmol/L '30  28  28   ' Calcium 8.9 - 10.3 mg/dL 9.6  8.9  8.7   Total Protein 6.5 - 8.1 g/dL 7.0   6.6   Total Bilirubin 0.3 - 1.2 mg/dL 0.5   0.3   Alkaline Phos 38 - 126 U/L 78   91   AST 15 - 41 U/L 15   22   ALT 0 - 44 U/L 14   24     Lab Results  Component Value Date   WBC 6.2 08/23/2021   HGB 15.3 (H) 08/23/2021   HCT 45.8 08/23/2021   MCV 92.0 08/23/2021   PLT 259 08/23/2021   NEUTROABS 3.9 08/23/2021    ASSESSMENT &  PLAN:  Malignant neoplasm of lower-outer quadrant of left breast of female, estrogen receptor positive ([place]) 09/04/2021:Left lumpectomy: Grade 2 IDC with lobular features, intermediate grade DCIS, 0.9 cm, margins negative, ER 100%, PR 95%, HER2 negative, Ki-67 10%  Pathology counseling: I discussed the final pathology report of the patient provided  a copy of this report. I discussed the margins as well as lymph node surgeries. We also discussed the final staging along with previously performed ER/PR and HER-2/neu testing.  Treatment plan: 1.  +/- XRT 2.  Antiestrogen therapy with letrozole 2.5 mg daily x5 years  Letrozole counseling: We discussed the risks and benefits of anti-estrogen therapy with aromatase inhibitors. These include but not limited to insomnia, hot flashes, mood changes, vaginal dryness, bone density loss, and weight gain. We strongly believe that the benefits far outweigh the risks. Patient understands these risks and consented to starting treatment. Planned treatment duration is 5 years.  Return to clinic in 3 months for survivorship care plan visit     No orders of the defined types were placed in this encounter.  The patient has a good understanding of the overall plan. she agrees with it. she will call with any problems that may develop before the next visit here. Total time spent: 30 mins including face to face time and time spent for planning, charting and co-ordination of care   Harriette Ohara, MD 09/15/21    I Gardiner Coins am scribing for Dr. Lindi Adie  I have reviewed the above documentation for accuracy and completeness, and I agree with the above.

## 2021-09-12 ENCOUNTER — Encounter (HOSPITAL_COMMUNITY): Payer: Self-pay

## 2021-09-14 NOTE — Assessment & Plan Note (Signed)
09/04/2021:Left lumpectomy: Grade 2 IDC with lobular features, intermediate grade DCIS, 0.9 cm, margins negative, ER 100%, PR 95%, HER2 negative, Ki-67 10%  Pathology counseling: I discussed the final pathology report of the patient provided  a copy of this report. I discussed the margins as well as lymph node surgeries. We also discussed the final staging along with previously performed ER/PR and HER-2/neu testing.  Treatment plan: 1.  +/- XRT 2.  Antiestrogen therapy with letrozole 2.5 mg daily x5 years  Letrozole counseling: We discussed the risks and benefits of anti-estrogen therapy with aromatase inhibitors. These include but not limited to insomnia, hot flashes, mood changes, vaginal dryness, bone density loss, and weight gain. We strongly believe that the benefits far outweigh the risks. Patient understands these risks and consented to starting treatment. Planned treatment duration is 5 years.  Return to clinic in 3 months for survivorship care plan visit

## 2021-09-15 ENCOUNTER — Inpatient Hospital Stay: Payer: Medicare PPO | Attending: Hematology and Oncology | Admitting: Hematology and Oncology

## 2021-09-15 ENCOUNTER — Other Ambulatory Visit: Payer: Self-pay

## 2021-09-15 DIAGNOSIS — Z800 Family history of malignant neoplasm of digestive organs: Secondary | ICD-10-CM | POA: Insufficient documentation

## 2021-09-15 DIAGNOSIS — Z803 Family history of malignant neoplasm of breast: Secondary | ICD-10-CM | POA: Diagnosis not present

## 2021-09-15 DIAGNOSIS — Z170 Estrogen receptor positive status [ER+]: Secondary | ICD-10-CM | POA: Insufficient documentation

## 2021-09-15 DIAGNOSIS — C50512 Malignant neoplasm of lower-outer quadrant of left female breast: Secondary | ICD-10-CM | POA: Diagnosis present

## 2021-09-15 DIAGNOSIS — Z8041 Family history of malignant neoplasm of ovary: Secondary | ICD-10-CM | POA: Diagnosis not present

## 2021-09-15 MED ORDER — LETROZOLE 2.5 MG PO TABS: 2.5000 mg | ORAL_TABLET | Freq: Every day | ORAL | 3 refills | Status: DC

## 2021-09-19 ENCOUNTER — Encounter: Payer: Self-pay | Admitting: Genetic Counselor

## 2021-09-19 ENCOUNTER — Telehealth: Payer: Self-pay | Admitting: Genetic Counselor

## 2021-09-19 DIAGNOSIS — Z1379 Encounter for other screening for genetic and chromosomal anomalies: Secondary | ICD-10-CM | POA: Insufficient documentation

## 2021-09-19 NOTE — Telephone Encounter (Signed)
I contacted [name] to discuss her genetic testing results. No pathogenic variants were identified in the 47 genes analyzed. Detailed clinic note to follow.  The test report has been scanned into EPIC and is located under the Molecular Pathology section of the Results Review tab.  A portion of the result report is included below for reference.   Christine Passy, MS, [facility] Genetic Counselor [email or web address] (P) [phone]

## 2021-09-27 ENCOUNTER — Encounter: Payer: Self-pay | Admitting: Genetic Counselor

## 2021-09-27 ENCOUNTER — Ambulatory Visit: Payer: Self-pay | Admitting: Genetic Counselor

## 2021-09-27 DIAGNOSIS — Z1379 Encounter for other screening for genetic and chromosomal anomalies: Secondary | ICD-10-CM

## 2021-09-27 NOTE — Progress Notes (Signed)
HPI:   Christine Harrell was previously seen in the [facility] clinic due to a personal and family history of cancer and concerns regarding a hereditary predisposition to cancer. Please refer to our prior cancer genetics clinic note for more information regarding our discussion, assessment and recommendations, at the time. Christine Harrell recent genetic test results were disclosed to her, as were recommendations warranted by these results. These results and recommendations are discussed in more detail below.  CANCER HISTORY:  Oncology History  Malignant neoplasm of lower-outer quadrant of left breast of female, estrogen receptor positive ([place])  08/14/2021 Initial Diagnosis   Screening detected left breast mass at 6 o'clock position measuring 0.7 cm, axilla negative, biopsy revealed grade 2 IDC with lobular features with ADH ER 100%, PR 95%, Ki-67 10%, HER2 equivocal by IHC FISH negative   08/23/2021 Cancer Staging   Staging form: Breast, AJCC 8th Edition - Clinical: Stage IA (cT1b, cN0, cM0, G2, ER+, PR+, HER2-) - Signed by Nicholas Lose, MD on 08/23/2021 Stage prefix: Initial diagnosis Histologic grading system: 3 grade system   09/04/2021 Surgery   Left lumpectomy: Grade 2 IDC with lobular features, intermediate grade DCIS, 0.9 cm, margins negative, ER 100%, PR 95%, HER2 negative, Ki-67 10%    Genetic Testing   [facility] CustomNext Panel was Negative. Report date is 09/18/2021.  The CustomNext-Cancer+RNAinsight panel offered by [employer] [facility] includes sequencing and rearrangement analysis for the following 47 genes:  APC, ATM, AXIN2, BARD1, BMPR1A, BRCA1, BRCA2, BRIP1, CDH1, CDK4, CDKN2A, CHEK2, CTNNA1, DICER1, EPCAM, GREM1, HOXB13, KIT, MEN1, MLH1, MSH2, MSH3, MSH6, MUTYH, NBN, NF1, NTHL1, PALB2, PDGFRA, PMS2, POLD1, POLE, PTEN, RAD50, RAD51C, RAD51D, SDHA, SDHB, SDHC, SDHD, SMAD4, SMARCA4, STK11, TP53, TSC1, TSC2, and VHL.  RNA data is routinely analyzed for use in variant interpretation for all  genes.     FAMILY HISTORY:  We obtained a detailed, 4-generation family history.  Significant diagnoses are listed below:      Family History  Problem Relation Age of Onset   Breast cancer Mother         Christine Harrell's mother was diagnosed with breast cancer at age 69, ovarian cancer at age 8, and colon cancer at age 40, she died at age 54. Christine Harrell's niece was diagnosed with breast cancer in her 61s. Christine Harrell reports approximately 5 maternal cousins with cancer (unknown types). Her father was diagnosed with pancreatic cancer at age 65 and died at age 79. Christine Harrell's daughter possibly had negative hereditary cancer genetic testing. There is no reported Ashkenazi Jewish ancestry.   GENETIC TEST RESULTS:  The [employer] CustomNext Panel found no pathogenic mutations.   The CustomNext-Cancer+RNAinsight panel offered by [employer] includes sequencing and rearrangement analysis for the following 47 genes:  APC, ATM, AXIN2, BARD1, BMPR1A, BRCA1, BRCA2, BRIP1, CDH1, CDK4, CDKN2A, CHEK2, CTNNA1, DICER1, EPCAM, GREM1, HOXB13, KIT, MEN1, MLH1, MSH2, MSH3, MSH6, MUTYH, NBN, NF1, NTHL1, PALB2, PDGFRA, PMS2, POLD1, POLE, PTEN, RAD50, RAD51C, RAD51D, SDHA, SDHB, SDHC, SDHD, SMAD4, SMARCA4, STK11, TP53, TSC1, TSC2, and VHL.  RNA data is routinely analyzed for use in variant interpretation for all genes.  The test report has been scanned into EPIC and is located under the Molecular Pathology section of the Results Review tab.  A portion of the result report is included below for reference. Genetic testing reported out on 09/18/2021.       Even though a pathogenic variant was not identified, possible explanations for the cancer in the family may include: There  may be no hereditary risk for cancer in the family. The cancers in Christine Harrell and/or her family may be due to other genetic or environmental factors. There may be a gene mutation in one of these genes that current testing methods cannot detect, but that  chance is small. There could be another gene that has not yet been discovered, or that we have not yet tested, that is responsible for the cancer diagnoses in the family.  It is also possible there is a hereditary cause for the cancer in the family that Christine Harrell did not inherit.  Therefore, it is important to remain in touch with cancer genetics in the future so that we can continue to offer Christine Harrell the most up to date genetic testing.   ADDITIONAL GENETIC TESTING:  We discussed with Christine Harrell that her genetic testing was fairly extensive.  If there are genes identified to increase cancer risk that can be analyzed in the future, we would be happy to discuss and coordinate this testing at that time.    CANCER SCREENING RECOMMENDATIONS:  Christine Harrell test result is considered negative (normal).  This means that we have not identified a hereditary cause for her personal and family history of cancer at this time.   An individual's cancer risk and medical management are not determined by genetic test results alone. Overall cancer risk assessment incorporates additional factors, including personal medical history, family history, and any available genetic information that may result in a personalized plan for cancer prevention and surveillance. Therefore, it is recommended she continue to follow the cancer management and screening guidelines provided by her oncology and primary healthcare provider.  RECOMMENDATIONS FOR FAMILY MEMBERS:   Other members of the family may still carry a pathogenic variant in one of these genes that Christine Harrell did not inherit. Based on the family history, we recommend her niece, who was diagnosed with breast cancer in her 41s, have genetic counseling and testing.   FOLLOW-UP:  Cancer genetics is a rapidly advancing field and it is possible that new genetic tests will be appropriate for her and/or her family members in the future. We encouraged her to remain in contact with  cancer genetics on an annual basis so we can update her personal and family histories and let her know of advances in cancer genetics that may benefit this family.   Our contact number was provided. Ms. Bunkley questions were answered to her satisfaction, and she knows she is welcome to call us at anytime with additional questions or concerns.   Lucille Passy, MS, [facility] Genetic Counselor [email or web address] (P) [phone]

## 2021-09-29 NOTE — Progress Notes (Signed)
mammLocation of Breast Cancer:lower-outer quadrant of left breast  Histology per Pathology Report:   A. 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 LEFT BREAST, SUPERIOR MARGIN, EXCISION:  Benign breast with stromal fibrosis  Negative for carcinoma   Receptor Status: ER(100%), PR (95%), Her2-neu (negative), Ki-(10%)  Did patient present with symptoms (if so, please note symptoms) or was this found on screening mammography?: mammography  Past/Anticipated interventions by surgeon, if VXY:IAXK BREAST RADIOACTIVE SEED LOCALIZED LUMPECTOMY (Left) 09/04/2021 by Dr. Marlou Starks  Past/Anticipated interventions by medical oncology, if any: Antiestrogen therapy with letrozole 2.5 mg daily x5 years  Lymphedema issues, if any:  no    Pain issues, if any:  no   SAFETY ISSUES: Prior radiation? no Pacemaker/ICD? no Possible current pregnancy?no Is the patient on methotrexate? no  Current Complaints / other details:  Patient is here with her grandson.  BP (!) 154/54 (BP Location: Right Arm, Patient Position: Sitting, Cuff Size: Normal)   Pulse 72   Temp 97.6 F (36.4 C)   Resp 20   Ht '5\' 2"'  (1.575 m)   Wt 181 lb 12.8 oz (82.5 kg)   SpO2 98%   BMI 33.25 kg/m      Jacqulyn Liner, RN 09/29/2021,9:05 AM

## 2021-10-01 NOTE — Progress Notes (Signed)
Radiation Oncology         [phone] ________________________________  Name: Christine Harrell MRN: [id number]  Date: 10/02/2021  DOB: [birth year]  Re-Evaluation Note  CC: Corrington, Delsa Grana, MD  Nicholas Lose, MD  No diagnosis found.  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 She was seen in the multidisciplinary breast clinic on 08/23/21.   Since consultation, she underwent genetic testing on 08/23/21. Results showed no clinically significant variants detected by +RNAinsight testing.  She opted to proceed with left breast lumpectomy without nodal biopsies on 09/04/21 under the care of Dr. Marlou Starks. Pathology from the procedure revealed: grade 2 invasive moderately differentiated ductal adenocarcinoma with lobular features measuring 0.9 x 0.5 x 0.3 cm, and intermediate grade DCIS. All margins negative for both invasive and in-situ disease. Prognostic indicators significant for: estrogen receptor 100% positive and progesterone receptor 95% positive, both with strong staining intensity; Proliferation marker Ki67 at 10%; Her2 status negative; Grade 2.   The patient has met with Dr. Lindi Adie and will proceed with antiestrogen therapy consisting of letrozole 2.5 mg daily x5 years following completion of XRT.  On review of systems, the patient reports ***. She denies *** and any other symptoms.    Allergies:  has No Known Allergies.  Meds: Current Outpatient Medications  Medication Sig Dispense Refill   amLODipine (NORVASC) 5 MG tablet Take 5 mg by mouth daily.     Calcium Carb-Cholecalciferol (CALCIUM 600 + D PO) Take 1 tablet by mouth daily.     cetirizine (ZYRTEC) 10 MG tablet Take by mouth.     cholecalciferol (VITAMIN D3) 25 MCG (1000 UNIT) tablet Take 1,000 Units by mouth  daily.     citalopram (CELEXA) 20 MG tablet Take 20 mg by mouth every morning.     letrozole (FEMARA) 2.5 MG tablet Take 1 tablet (2.5 mg total) by mouth daily. 90 tablet 3   levothyroxine (SYNTHROID) 75 MCG tablet Take 75 mcg by mouth daily.     oxyCODONE (ROXICODONE) 5 MG immediate release tablet Take 1 tablet (5 mg total) by mouth every 6 (six) hours as needed for severe pain. 10 tablet 0   pravastatin (PRAVACHOL) 40 MG tablet Take 40 mg by mouth daily.     solifenacin (VESICARE) 5 MG tablet Take 5 mg by mouth at bedtime.     No current facility-administered medications for this encounter.    Physical Findings: The patient is in no acute distress. Patient is alert and oriented.  vitals were not taken for this visit.  No significant changes. Lungs are clear to auscultation bilaterally. Heart has regular rate and rhythm. No palpable cervical, supraclavicular, or axillary adenopathy. Abdomen soft, non-tender, normal bowel sounds. Right Breast: no palpable mass, nipple discharge or bleeding. Left Breast: ***  Lab Findings: Lab Results  Component Value Date   WBC 6.2 08/23/2021   HGB 15.3 (H) 08/23/2021   HCT 45.8 08/23/2021   MCV 92.0 08/23/2021   PLT 259 08/23/2021    Radiographic Findings: MM Breast Surgical Specimen  Result Date: 09/04/2021 CLINICAL DATA:  Evaluate specimen following radioactive seed localization of a left breast lesion. EXAM: SPECIMEN RADIOGRAPH OF THE LEFT BREAST COMPARISON:  Previous exam(s). FINDINGS: Status post excision of the left breast. The radioactive seed and biopsy marker clip are present, completely intact,  and were marked for pathology. IMPRESSION: Specimen radiograph of the left breast. Electronically Signed   By: Lajean Manes M.D.   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 ***  -----------------------------------  Blair Promise, PhD, MD  This document serves as a record of services personally performed by Gery Pray, MD. It was created on his behalf by Roney Mans, a trained medical scribe. The creation of this record is based on the scribe's personal observations and the provider's statements to them. This document has been checked and approved by the attending provider.

## 2021-10-02 ENCOUNTER — Ambulatory Visit
Admission: RE | Admit: 2021-10-02 | Discharge: 2021-10-02 | Disposition: A | Discharge status: Home / Self Care | Payer: Medicare PPO | Source: Ambulatory Visit | Attending: Radiation Oncology | Admitting: Radiation Oncology

## 2021-10-02 ENCOUNTER — Encounter: Payer: Self-pay | Admitting: Radiation Oncology

## 2021-10-02 ENCOUNTER — Ambulatory Visit: Payer: Medicare PPO | Admitting: Radiation Oncology

## 2021-10-02 ENCOUNTER — Other Ambulatory Visit: Payer: Self-pay

## 2021-10-02 VITALS — BP 154/54 | HR 72 | Temp 97.6°F | Resp 20 | Ht 62.0 in | Wt 181.8 lb

## 2021-10-02 DIAGNOSIS — Z170 Estrogen receptor positive status [ER+]: Secondary | ICD-10-CM | POA: Insufficient documentation

## 2021-10-02 DIAGNOSIS — Z79899 Other long term (current) drug therapy: Secondary | ICD-10-CM | POA: Insufficient documentation

## 2021-10-02 DIAGNOSIS — C50512 Malignant neoplasm of lower-outer quadrant of left female breast: Secondary | ICD-10-CM | POA: Insufficient documentation

## 2021-10-02 DIAGNOSIS — Z79811 Long term (current) use of aromatase inhibitors: Secondary | ICD-10-CM | POA: Diagnosis not present

## 2021-10-02 DIAGNOSIS — Z79890 Hormone replacement therapy (postmenopausal): Secondary | ICD-10-CM | POA: Insufficient documentation

## 2021-10-09 ENCOUNTER — Encounter: Payer: Self-pay | Admitting: *Deleted

## 2021-10-09 DIAGNOSIS — Z170 Estrogen receptor positive status [ER+]: Secondary | ICD-10-CM

## 2021-12-22 ENCOUNTER — Inpatient Hospital Stay: Payer: Medicare PPO | Attending: Hematology and Oncology | Admitting: Adult Health

## 2021-12-22 ENCOUNTER — Inpatient Hospital Stay: Payer: Medicare PPO

## 2021-12-22 ENCOUNTER — Encounter: Payer: Self-pay | Admitting: Adult Health

## 2021-12-22 VITALS — BP 150/62 | HR 80 | Temp 97.3°F | Resp 18 | Ht 62.0 in | Wt 182.0 lb

## 2021-12-22 DIAGNOSIS — Z800 Family history of malignant neoplasm of digestive organs: Secondary | ICD-10-CM | POA: Insufficient documentation

## 2021-12-22 DIAGNOSIS — C50512 Malignant neoplasm of lower-outer quadrant of left female breast: Secondary | ICD-10-CM | POA: Diagnosis present

## 2021-12-22 DIAGNOSIS — Z23 Encounter for immunization: Secondary | ICD-10-CM | POA: Diagnosis not present

## 2021-12-22 DIAGNOSIS — Z170 Estrogen receptor positive status [ER+]: Secondary | ICD-10-CM | POA: Diagnosis present

## 2021-12-22 DIAGNOSIS — Z8041 Family history of malignant neoplasm of ovary: Secondary | ICD-10-CM | POA: Diagnosis not present

## 2021-12-22 DIAGNOSIS — Z803 Family history of malignant neoplasm of breast: Secondary | ICD-10-CM | POA: Diagnosis not present

## 2021-12-22 MED ORDER — INFLUENZA VAC SPLIT QUAD 0.5 ML IM SUSY
0.5000 mL | PREFILLED_SYRINGE | Freq: Once | INTRAMUSCULAR | Status: AC
  Administered 2021-12-22: 0.5 mL via INTRAMUSCULAR
  Filled 2021-12-22: qty 0.5

## 2021-12-22 NOTE — Progress Notes (Signed)
SURVIVORSHIP VISIT:  BRIEF ONCOLOGIC HISTORY:  Oncology History  Malignant neoplasm of lower-outer quadrant of left breast of female, estrogen receptor positive ([facility])  08/14/2021 Initial Diagnosis   Screening detected left breast mass at 6 o'clock position measuring 0.7 cm, axilla negative, biopsy revealed grade 2 IDC with lobular features with ADH ER 100%, PR 95%, Ki-67 10%, HER2 equivocal by IHC FISH negative   08/23/2021 Cancer Staging   Staging form: Breast, AJCC 8th Edition - Clinical: Stage IA (cT1b, cN0, cM0, G2, ER+, PR+, HER2-) - Signed by Nicholas Lose, MD on 08/23/2021 Stage prefix: Initial diagnosis Histologic grading system: 3 grade system   09/04/2021 Surgery   Left lumpectomy: Grade 2 IDC with lobular features, intermediate grade DCIS, 0.9 cm, margins negative, ER 100%, PR 95%, HER2 negative, Ki-67 10%    Genetic Testing   [facility] CustomNext Panel was Negative. Report date is 09/18/2021.  The CustomNext-Cancer+RNAinsight panel offered by [facility] includes sequencing and rearrangement analysis for the following 47 genes:  APC, ATM, AXIN2, BARD1, BMPR1A, BRCA1, BRCA2, BRIP1, CDH1, CDK4, CDKN2A, CHEK2, CTNNA1, DICER1, EPCAM, GREM1, HOXB13, KIT, MEN1, MLH1, MSH2, MSH3, MSH6, MUTYH, NBN, NF1, NTHL1, PALB2, PDGFRA, PMS2, POLD1, POLE, PTEN, RAD50, RAD51C, RAD51D, SDHA, SDHB, SDHC, SDHD, SMAD4, SMARCA4, STK11, TP53, TSC1, TSC2, and VHL.  RNA data is routinely analyzed for use in variant interpretation for all genes.   09/2021 -  Anti-estrogen oral therapy   2.5 mg Letrozole x 5 years     INTERVAL HISTORY:  Ms. Bryner to review her survivorship care plan detailing her treatment course for breast cancer, as well as monitoring long-term side effects of that treatment, education regarding health maintenance, screening, and overall wellness and health promotion.     Overall, Ms. Rufus reports feeling quite well   REVIEW OF SYSTEMS:  Review of Systems  Constitutional:  Negative for  appetite change, chills, fatigue, fever and unexpected weight change.  HENT:   Negative for hearing loss, lump/mass and trouble swallowing.   Eyes:  Negative for eye problems and icterus.  Respiratory:  Negative for chest tightness, cough and shortness of breath.   Cardiovascular:  Negative for chest pain, leg swelling and palpitations.  Gastrointestinal:  Negative for abdominal distention, abdominal pain, constipation, diarrhea, nausea and vomiting.  Endocrine: Positive for hot flashes.  Genitourinary:  Negative for difficulty urinating.   Musculoskeletal:  Negative for arthralgias.  Skin:  Negative for itching and rash.  Neurological:  Negative for dizziness, extremity weakness, headaches and numbness.  Hematological:  Negative for adenopathy. Does not bruise/bleed easily.  Psychiatric/Behavioral:  Negative for depression. The patient is not nervous/anxious.    Breast: Denies any new nodularity, masses, tenderness, nipple changes, or nipple discharge.      ONCOLOGY TREATMENT TEAM:  1. Surgeon:  Dr. Marlou Starks at [facility] 2. Medical Oncologist: Dr. Lindi Adie  3. Radiation Oncologist: Dr. Sondra Come    PAST MEDICAL/SURGICAL HISTORY:  Past Medical History:  Diagnosis Date   Hypertension    Hypothyroidism    Past Surgical History:  Procedure Laterality Date   APPENDECTOMY     BACK SURGERY     BREAST LUMPECTOMY WITH RADIOACTIVE SEED LOCALIZATION Left 09/04/2021   Procedure: LEFT BREAST RADIOACTIVE SEED LOCALIZED LUMPECTOMY;  Surgeon: Jovita Kussmaul, MD;  Location: [facility];  Service: General;  Laterality: Left;   CATARACT EXTRACTION     TONSILECTOMY, ADENOIDECTOMY, BILATERAL MYRINGOTOMY AND TUBES       ALLERGIES:  No Known Allergies   CURRENT MEDICATIONS:  Outpatient Encounter Medications as of 12/22/2021  Medication Sig   amLODipine (NORVASC) 5 MG tablet Take 5 mg by mouth daily.   Calcium Carb-Cholecalciferol (CALCIUM 600 + D PO) Take 1 tablet by  mouth daily.   cholecalciferol (VITAMIN D3) 25 MCG (1000 UNIT) tablet Take 1,000 Units by mouth daily.   citalopram (CELEXA) 20 MG tablet Take 20 mg by mouth every morning.   letrozole (FEMARA) 2.5 MG tablet Take 1 tablet (2.5 mg total) by mouth daily.   levothyroxine (SYNTHROID) 75 MCG tablet Take 75 mcg by mouth daily.   oxyCODONE (ROXICODONE) 5 MG immediate release tablet Take 1 tablet (5 mg total) by mouth every 6 (six) hours as needed for severe pain. (Patient not taking: Reported on 10/02/2021)   pravastatin (PRAVACHOL) 40 MG tablet Take 40 mg by mouth daily.   solifenacin (VESICARE) 5 MG tablet Take 5 mg by mouth at bedtime.   No facility-administered encounter medications on file as of 12/22/2021.     ONCOLOGIC FAMILY HISTORY:  Family History  Problem Relation Age of Onset   Breast cancer Mother 81   Colon cancer Mother 70   Ovarian cancer Mother 20   Pancreatic cancer Father 84   Breast cancer Niece 49 - 79      SOCIAL HISTORY:  Social History   Socioeconomic History   Marital status: Single    Spouse name: Not on file   Number of children: Not on file   Years of education: Not on file   Highest education level: Not on file  Occupational History   Not on file  Tobacco Use   Smoking status: Never   Smokeless tobacco: Never  Vaping Use   Vaping Use: Never used  Substance and Sexual Activity   Alcohol use: Not Currently   Drug use: Never   Sexual activity: Not on file  Other Topics Concern   Not on file  Social History Narrative   Not on file   Social Determinants of Health   Financial Resource Strain: Not on file  Food Insecurity: Not on file  Transportation Needs: Not on file  Physical Activity: Not on file  Stress: Not on file  Social Connections: Not on file  Intimate Partner Violence: Not on file     OBSERVATIONS/OBJECTIVE:  BP (!) 150/62 (BP Location: Left Arm, Patient Position: Sitting)   Pulse 80   Temp (!) 97.3 F (36.3 C) (Temporal)    Resp 18   Ht _0  (1.575 m)   Wt 182 lb (82.6 kg)   SpO2 96%   BMI 33.29 kg/m  GENERAL: Patient is a well appearing female in no acute distress HEENT:  Sclerae anicteric.  Oropharynx clear and moist. No ulcerations or evidence of oropharyngeal candidiasis. Neck is supple.  NODES:  No cervical, supraclavicular, or axillary lymphadenopathy palpated.  BREAST EXAM: Left breast status postlumpectomy no sign of local recurrence, right breast is benign. LUNGS:  Clear to auscultation bilaterally.  No wheezes or rhonchi. HEART:  Regular rate and rhythm. No murmur appreciated. ABDOMEN:  Soft, nontender.  Positive, normoactive bowel sounds. No organomegaly palpated. MSK:  No focal spinal tenderness to palpation. Full range of motion bilaterally in the upper extremities. EXTREMITIES:  No peripheral edema.   SKIN:  Clear with no obvious rashes or skin changes. No nail dyscrasia. NEURO:  Nonfocal. Well oriented.  Appropriate affect.  LABORATORY DATA:  None for this visit.  DIAGNOSTIC IMAGING:  None for this visit.      ASSESSMENT  AND PLAN:  Ms.. Flow is a pleasant 81 y.o. female with Stage IA left breast invasive ductal carcinoma, ER+/PR+/HER2-, diagnosed in 08/2021, treated with lumpectomy and anti-estrogen therapy with Letrozole beginning in 09/2021.  She presents to the Survivorship Clinic for our initial meeting and routine follow-up post-completion of treatment for breast cancer.    1. Stage IA left breast cancer:  Ms. Hight is continuing to recover from definitive treatment for breast cancer. She will follow-up with her medical oncologist, Dr. Lindi Adie in 6 months with history and physical exam per surveillance protocol.  She will continue her anti-estrogen therapy with letrozole. Thus far, she is tolerating the letrozole well, with minimal side effects. She was instructed to make Dr. Lindi Adie or myself aware if she begins to experience any worsening side effects of the medication and I could see  her back in clinic to help manage those side effects, as needed. Her mammogram is due June 2024; orders placed today. Today, a comprehensive survivorship care plan and treatment summary was reviewed with the patient today detailing her breast cancer diagnosis, treatment course, potential late/long-term effects of treatment, appropriate follow-up care with recommendations for the future, and patient education resources.  A copy of this summary, along with a letter will be sent to the patient's primary care provider via mail/fax/In Basket message after today's visit.    2. Bone health:  Given Ms. Krauter's age/history of breast cancer and her current treatment regimen including anti-estrogen therapy with letrozole, she is at risk for bone demineralization.  She tells me that she undergoes bone density testing regularly with Dr. Matthew Saras, we reached out to his office to get a copy of these results.  With her taking letrozole we recommend bone density testing every 2 years and that she is try to stay with the same location so that we can determine if there is bone density loss due to the letrozole.  She was given education on specific activities to promote bone health.  3. Cancer screening:  Due to Ms. Sisney's history and her age, she should receive screening for skin cancers.  The information and recommendations are listed on the patient's comprehensive care plan/treatment summary and were reviewed in detail with the patient.    4. Health maintenance and wellness promotion: Ms. Prue was encouraged to consume 5-7 servings of fruits and vegetables per day. We reviewed the "Nutrition Rainbow" handout.  She was also encouraged to engage in moderate to vigorous exercise for 30 minutes per day most days of the week. We discussed the [employer] fitness program, which is designed for cancer survivors to help them become more physically fit after cancer treatments.  She was instructed to limit her alcohol consumption  and continue to abstain from tobacco use.     5. Support services/counseling: It is not uncommon for this period of the patient's cancer care trajectory to be one of many emotions and stressors.  She was given information regarding our available services and encouraged to contact me with any questions or for help enrolling in any of our support group/programs.    Follow up instructions:    -Return to cancer center 6 months  -Mammogram due in 07/2022 -She is welcome to return back to the Survivorship Clinic at any time; no additional follow-up needed at this time.  -Consider referral back to survivorship as a long-term survivor for continued surveillance  The patient was provided an opportunity to ask questions and all were answered. The patient agreed with the plan  and demonstrated an understanding of the instructions.   Total encounter time:40 minutes*in face-to-face visit time, chart review, lab review, care coordination, order entry, and documentation of the encounter time.  Wilber Bihari, NP 12/22/21 4:01 PM Medical Oncology and Hematology [facility] [place], [place] [address] Tel. [phone]    Fax. [phone]  *Total Encounter Time as defined by the Centers for Medicare and Medicaid Services includes, in addition to the face-to-face time of a patient visit (documented in the note above) non-face-to-face time: obtaining and reviewing outside history, ordering and reviewing medications, tests or procedures, care coordination (communications with other health care professionals or caregivers) and documentation in the medical record.

## 2021-12-25 ENCOUNTER — Telehealth: Payer: Self-pay | Admitting: Adult Health

## 2021-12-25 NOTE — Telephone Encounter (Signed)
Scheduled appointment per 11/10 los. Left voicemail.

## 2022-06-18 ENCOUNTER — Other Ambulatory Visit: Payer: Self-pay | Admitting: Hematology and Oncology

## 2022-06-19 ENCOUNTER — Telehealth: Payer: Self-pay | Admitting: Hematology and Oncology

## 2022-06-19 NOTE — Progress Notes (Signed)
Patient Care Team: Corrington, [name], MD as PCP - General (Family Medicine) Griselda Miner, MD as Consulting Physician (General Surgery) Serena Croissant, MD as Consulting Physician (Hematology and Oncology) Antony Blackbird, MD as Consulting Physician (Radiation Oncology)  DIAGNOSIS:  Encounter Diagnosis  Name Primary?   Malignant neoplasm of lower-outer quadrant of left breast of female, estrogen receptor positive ([facility]) Yes    SUMMARY OF ONCOLOGIC HISTORY: Oncology History  Malignant neoplasm of lower-outer quadrant of left breast of female, estrogen receptor positive ([facility])  08/14/2021 Initial Diagnosis   Screening detected left breast mass at 6 o'clock position measuring 0.7 cm, axilla negative, biopsy revealed grade 2 IDC with lobular features with ADH ER 100%, PR 95%, Ki-67 10%, HER2 equivocal by IHC FISH negative   08/23/2021 Cancer Staging   Staging form: Breast, AJCC 8th Edition - Clinical: Stage IA (cT1b, cN0, cM0, G2, ER+, PR+, HER2-) - Signed by Serena Croissant, MD on 08/23/2021 Stage prefix: Initial diagnosis Histologic grading system: 3 grade system   09/04/2021 Surgery   Left lumpectomy: Grade 2 IDC with lobular features, intermediate grade DCIS, 0.9 cm, margins negative, ER 100%, PR 95%, HER2 negative, Ki-67 10%    Genetic Testing   [facility] CustomNext Panel was Negative. Report date is 09/18/2021.  The CustomNext-Cancer+RNAinsight panel offered by [employer]es includes sequencing and rearrangement analysis for the following 47 genes:  APC, ATM, AXIN2, BARD1, BMPR1A, BRCA1, BRCA2, BRIP1, CDH1, CDK4, CDKN2A, CHEK2, CTNNA1, DICER1, EPCAM, GREM1, HOXB13, KIT, MEN1, MLH1, MSH2, MSH3, MSH6, MUTYH, NBN, NF1, NTHL1, PALB2, PDGFRA, PMS2, POLD1, POLE, PTEN, RAD50, RAD51C, RAD51D, SDHA, SDHB, SDHC, SDHD, SMAD4, SMARCA4, STK11, TP53, TSC1, TSC2, and VHL.  RNA data is routinely analyzed for use in variant interpretation for all genes.   09/2021 -  Anti-estrogen oral therapy   2.5 mg Letrozole  x 5 years     CHIEF COMPLIANT: Follow-up on antiestrogen therapy  INTERVAL HISTORY: Christine Harrell is a 82 y.o. female is here because of recent diagnosis of left breast mass. She presents to the clinic for a follow-up.  Shes here for follow-up on antiestrogen therapy.  She has been tolerating it very well.  She reports that she does have hot flashes but they are not bothering her too much.  They do wake her up once at night.  She denies any pain or discomfort in the breast.    ALLERGIES:  has No Known Allergies.  MEDICATIONS:  Current Outpatient Medications  Medication Sig Dispense Refill   amLODipine (NORVASC) 5 MG tablet Take 5 mg by mouth daily.     Calcium Carb-Cholecalciferol (CALCIUM 600 + D PO) Take 1 tablet by mouth daily.     cholecalciferol (VITAMIN D3) 25 MCG (1000 UNIT) tablet Take 1,000 Units by mouth daily.     citalopram (CELEXA) 20 MG tablet Take 20 mg by mouth every morning.     letrozole (FEMARA) 2.5 MG tablet TAKE ONE TABLET BY MOUTH EVERY DAY 90 tablet 3   levothyroxine (SYNTHROID) 75 MCG tablet Take 75 mcg by mouth daily.     oxyCODONE (ROXICODONE) 5 MG immediate release tablet Take 1 tablet (5 mg total) by mouth every 6 (six) hours as needed for severe pain. (Patient not taking: Reported on 10/02/2021) 10 tablet 0   pravastatin (PRAVACHOL) 40 MG tablet Take 40 mg by mouth daily.     solifenacin (VESICARE) 5 MG tablet Take 5 mg by mouth at bedtime.     No current facility-administered medications for this visit.  PHYSICAL EXAMINATION: ECOG PERFORMANCE STATUS: 1 - Symptomatic but completely ambulatory  Vitals:   06/22/22 1137  BP: (!) 145/51  Pulse: 68  Resp: 17  Temp: 98.1 F (36.7 C)  SpO2: 94%   Filed Weights   06/22/22 1137  Weight: 181 lb 12.8 oz (82.5 kg)    BREAST: No palpable masses or nodules in either right or left breasts. No palpable axillary supraclavicular or infraclavicular adenopathy no breast tenderness or nipple discharge. (exam  performed in the presence of a chaperone)  LABORATORY DATA:  I have reviewed the data as listed    Latest Ref Rng & Units 08/23/2021   12:25 PM 11/18/2007    2:05 PM 07/01/2007   10:28 AM  CMP  Glucose 70 - 99 mg/dL 469  76  78   BUN 8 - 23 mg/dL 27  16  16    Creatinine 0.44 - 1.00 mg/dL 6.29  5.28  4.13   Sodium 135 - 145 mmol/L 140  141  142   Potassium 3.5 - 5.1 mmol/L 4.4  3.9  4.0   Chloride 98 - 111 mmol/L 105  105  107   CO2 22 - 32 mmol/L 30  28  28    Calcium 8.9 - 10.3 mg/dL 9.6  8.9  8.7   Total Protein 6.5 - 8.1 g/dL 7.0   6.6   Total Bilirubin 0.3 - 1.2 mg/dL 0.5   0.3   Alkaline Phos 38 - 126 U/L 78   91   AST 15 - 41 U/L 15   22   ALT 0 - 44 U/L 14   24     Lab Results  Component Value Date   WBC 6.2 08/23/2021   HGB 15.3 (H) 08/23/2021   HCT 45.8 08/23/2021   MCV 92.0 08/23/2021   PLT 259 08/23/2021   NEUTROABS 3.9 08/23/2021    ASSESSMENT & PLAN:  Malignant neoplasm of lower-outer quadrant of left breast of female, estrogen receptor positive ([facility]) 09/04/2021:Left lumpectomy: Grade 2 IDC with lobular features, intermediate grade DCIS, 0.9 cm, margins negative, ER 100%, PR 95%, HER2 negative, Ki-67 10%  Treatment plan: 1.  Did not do radiation 2.  Antiestrogen therapy with letrozole 2.5 mg daily x5 years started 09/15/2021   Letrozole toxicities: Occasional hot flashes but otherwise tolerating it very well.  Breast cancer surveillance: Breast exam 06/22/2022: Benign Mammogram will be arranged towards the end of June 2024.  Return to clinic in 1 year for follow-up    No orders of the defined types were placed in this encounter.  The patient has a good understanding of the overall plan. she agrees with it. she will call with any problems that may develop before the next visit here. Total time spent: 30 mins including face to face time and time spent for planning, charting and co-ordination of care   Tamsen Meek, MD 06/22/22    I Janan Ridge  am acting as a Neurosurgeon for [employer]  I have reviewed the above documentation for accuracy and completeness, and I agree with the above.

## 2022-06-19 NOTE — Telephone Encounter (Signed)
Rescheduled appointment per room/resource. Patient is aware of the changes made to her upcoming appointment. 

## 2022-06-22 ENCOUNTER — Ambulatory Visit: Payer: Medicare PPO | Admitting: Hematology and Oncology

## 2022-06-22 ENCOUNTER — Inpatient Hospital Stay: Payer: Medicare PPO | Attending: Hematology and Oncology | Admitting: Hematology and Oncology

## 2022-06-22 VITALS — BP 145/51 | HR 68 | Temp 98.1°F | Resp 17 | Wt 181.8 lb

## 2022-06-22 DIAGNOSIS — Z79811 Long term (current) use of aromatase inhibitors: Secondary | ICD-10-CM | POA: Diagnosis not present

## 2022-06-22 DIAGNOSIS — Z803 Family history of malignant neoplasm of breast: Secondary | ICD-10-CM | POA: Diagnosis not present

## 2022-06-22 DIAGNOSIS — C50512 Malignant neoplasm of lower-outer quadrant of left female breast: Secondary | ICD-10-CM | POA: Insufficient documentation

## 2022-06-22 DIAGNOSIS — Z8041 Family history of malignant neoplasm of ovary: Secondary | ICD-10-CM | POA: Insufficient documentation

## 2022-06-22 DIAGNOSIS — R232 Flushing: Secondary | ICD-10-CM | POA: Diagnosis not present

## 2022-06-22 DIAGNOSIS — Z800 Family history of malignant neoplasm of digestive organs: Secondary | ICD-10-CM | POA: Insufficient documentation

## 2022-06-22 DIAGNOSIS — Z170 Estrogen receptor positive status [ER+]: Secondary | ICD-10-CM | POA: Insufficient documentation

## 2022-06-22 NOTE — Assessment & Plan Note (Signed)
09/04/2021:Left lumpectomy: Grade 2 IDC with lobular features, intermediate grade DCIS, 0.9 cm, margins negative, ER 100%, PR 95%, HER2 negative, Ki-67 10%  Treatment plan: 1.  Did not do radiation 2.  Antiestrogen therapy with letrozole 2.5 mg daily x5 years started 09/15/2021   Letrozole toxicities:  Breast cancer surveillance: Breast exam 06/22/2022: Benign Mammogram will be arranged towards the end of June 2024.  Return to clinic in 1 year for follow-up

## 2022-08-27 ENCOUNTER — Other Ambulatory Visit: Payer: Self-pay | Admitting: Obstetrics and Gynecology

## 2022-08-27 DIAGNOSIS — Z1231 Encounter for screening mammogram for malignant neoplasm of breast: Secondary | ICD-10-CM

## 2022-08-28 ENCOUNTER — Encounter: Payer: Self-pay | Admitting: Obstetrics and Gynecology

## 2022-08-28 ENCOUNTER — Telehealth: Payer: Self-pay

## 2022-08-28 NOTE — Telephone Encounter (Signed)
Called and LVM regarding bone density scan results. Received fax from [facility] with bone density result showing osteoporosis and included a note for Pt to be seen in office to discuss options for treatment. Asked for call back to set up appt with NP.

## 2022-09-03 ENCOUNTER — Telehealth: Payer: Self-pay | Admitting: Adult Health

## 2022-09-03 ENCOUNTER — Inpatient Hospital Stay: Payer: Medicare PPO | Attending: Hematology and Oncology | Admitting: Adult Health

## 2022-09-03 ENCOUNTER — Encounter: Payer: Self-pay | Admitting: Adult Health

## 2022-09-03 DIAGNOSIS — C50512 Malignant neoplasm of lower-outer quadrant of left female breast: Secondary | ICD-10-CM

## 2022-09-03 DIAGNOSIS — Z170 Estrogen receptor positive status [ER+]: Secondary | ICD-10-CM

## 2022-09-03 DIAGNOSIS — M816 Localized osteoporosis [Lequesne]: Secondary | ICD-10-CM | POA: Diagnosis not present

## 2022-09-03 MED ORDER — ALENDRONATE SODIUM 70 MG PO TABS: 70.0000 mg | ORAL_TABLET | ORAL | 2 refills | Status: DC

## 2022-09-03 NOTE — Progress Notes (Signed)
[facility] Cancer Follow up:    Christine Harrell, Christine Mody, MD [address] [place] [address]   DIAGNOSIS:  Cancer Staging  Malignant neoplasm of lower-outer quadrant of left breast of female, estrogen receptor positive (HCC) Staging form: Breast, AJCC 8th Edition - Clinical: Stage IA (cT1b, cN0, cM0, G2, ER+, PR+, HER2-) - Signed by Serena Croissant, MD on 08/23/2021 Stage prefix: Initial diagnosis Histologic grading system: 3 grade system  I connected with Christine Harrell on 09/03/22 at 11:45 AM EDT by telephone and verified that I am speaking with the correct person using two identifiers.  I discussed the limitations, risks, security and privacy concerns of performing an evaluation and management service by telephone and the availability of in person appointments.  I also discussed with the patient that there may be a patient responsible charge related to this service. The patient expressed understanding and agreed to proceed.   Patient location: Home Provider location: [facility] office  SUMMARY OF ONCOLOGIC HISTORY: Oncology History  Malignant neoplasm of lower-outer quadrant of left breast of female, estrogen receptor positive (HCC)  08/14/2021 Initial Diagnosis   Screening detected left breast mass at 6 o'clock position measuring 0.7 cm, axilla negative, biopsy revealed grade 2 IDC with lobular features with ADH ER 100%, PR 95%, Ki-67 10%, HER2 equivocal by IHC FISH negative   08/23/2021 Cancer Staging   Staging form: Breast, AJCC 8th Edition - Clinical: Stage IA (cT1b, cN0, cM0, G2, ER+, PR+, HER2-) - Signed by Serena Croissant, MD on 08/23/2021 Stage prefix: Initial diagnosis Histologic grading system: 3 grade system   09/04/2021 Surgery   Left lumpectomy: Grade 2 IDC with lobular features, intermediate grade DCIS, 0.9 cm, margins negative, ER 100%, PR 95%, HER2 negative, Ki-67 10%    Genetic Testing   [facility] CustomNext Panel was Negative. Report date is 09/18/2021.  The  CustomNext-Cancer+RNAinsight panel offered by [employer] [facility] includes sequencing and rearrangement analysis for the following 47 genes:  APC, ATM, AXIN2, BARD1, BMPR1A, BRCA1, BRCA2, BRIP1, CDH1, CDK4, CDKN2A, CHEK2, CTNNA1, DICER1, EPCAM, GREM1, HOXB13, KIT, MEN1, MLH1, MSH2, MSH3, MSH6, MUTYH, NBN, NF1, NTHL1, PALB2, PDGFRA, PMS2, POLD1, POLE, PTEN, RAD50, RAD51C, RAD51D, SDHA, SDHB, SDHC, SDHD, SMAD4, SMARCA4, STK11, TP53, TSC1, TSC2, and VHL.  RNA data is routinely analyzed for use in variant interpretation for all genes.   09/2021 -  Anti-estrogen oral therapy   2.5 mg Letrozole x 5 years     CURRENT THERAPY: Letrozole  INTERVAL HISTORY: Christine Harrell 82 y.o. female returns for f/u after undergoing bone density testing with Dr. Marcelle Overlie at [facility] in [place] on 08/22/2022 t hat demonstrated osteoporosis with a t score of -2.7 in her left forearm.    She is taking Vitamin D regularly.  She takes calcium when she remembers.  She walks with a walker.  She is limited with her mobility due to what she believes is muscular dystrophy.  She has never been diagnosed officially with this, but notes her symptoms are similar to her family's.  She is fearful to bring this up with her PCP because she worries her insurance might drop her coverage if laws change, and she knows that the condition is incurable.       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  MEDICAL HISTORY: Past Medical History:  Diagnosis Date   Appendicitis, acute 11/21/2010   Hypertension    Hypothyroidism     SURGICAL HISTORY: Past Surgical History:  Procedure Laterality Date   APPENDECTOMY     BACK SURGERY     BREAST LUMPECTOMY WITH RADIOACTIVE SEED LOCALIZATION Left 09/04/2021   Procedure: LEFT BREAST RADIOACTIVE SEED LOCALIZED LUMPECTOMY;  Surgeon: Griselda Miner, MD;  Location: [facility];  Service: General;  Laterality: Left;   CATARACT EXTRACTION     TONSILECTOMY, ADENOIDECTOMY, BILATERAL MYRINGOTOMY AND TUBES      SOCIAL HISTORY: Social History   Socioeconomic History   Marital status: Single    Spouse name: Not on file   Number of children: Not on file   Years of education: Not on file   Highest education level: Not on file  Occupational History   Not on file  Tobacco Use   Smoking status: Never   Smokeless tobacco: Never  Vaping Use   Vaping status: Never Used  Substance and Sexual Activity   Alcohol use: Not Currently   Drug use: Never   Sexual activity: Not on file  Other Topics Concern   Not on file  Social History Narrative   Not on file   Social Determinants of Health   Financial Resource Strain: Low Risk  (08/10/2022)   Received from [facility]   Overall Financial Resource Strain (CARDIA)    Difficulty of Paying Living Expenses: Not hard at all  Food Insecurity: No Food Insecurity (08/10/2022)   Received from [facility]   Hunger Vital Sign    Worried About Running Out of Food in the Last Year: Never true    Ran Out of Food in the Last Year: Never true  Transportation Needs: No Transportation Needs (08/10/2022)   Received from [facility] - Transportation    Lack of Transportation (Medical): No    Lack of Transportation (Non-Medical): No  Physical Activity:  Unknown (08/10/2022)   Received from [facility]   Exercise Vital Sign    Days of Exercise per Week: 0 days    Minutes of Exercise per Session: Not on file  Stress: No Stress Concern Present (08/10/2022)   Received from [facility] - Occupational Stress Questionnaire    Feeling of Stress : Only a little  Social Connections: Moderately Integrated (08/10/2022)   Received from [facility]   Social Network    How would you rate your social network (family, work, friends)?: Adequate participation with social networks  Intimate Partner Violence: Not At Risk (08/10/2022)   Received from [facility]   HITS    Over the last 12 months how often did your partner physically hurt you?: 1    Over the last 12 months how often did your partner insult you or talk down to you?: 1    Over the last 12 months how often did your partner threaten you with physical harm?: 1    Over the  last 12 months how often did your partner scream or curse at you?: 1    FAMILY HISTORY: Family History  Problem Relation Age of Onset   Breast cancer Mother 47   Colon cancer Mother 77   Ovarian cancer Mother 54   Pancreatic cancer Father 47   Breast cancer Niece 40 - 9    Review of Systems  Constitutional:  Negative for appetite change, chills, fatigue, fever and unexpected weight change.  HENT:   Negative for hearing loss, lump/mass and trouble swallowing.   Eyes:  Negative for eye problems and icterus.  Respiratory:  Negative for chest tightness, cough and shortness of breath.   Cardiovascular:  Negative for chest pain, leg swelling and palpitations.  Gastrointestinal:  Negative for abdominal distention, abdominal pain, constipation, diarrhea, nausea and vomiting.  Endocrine: Negative for hot flashes.  Genitourinary:  Negative for difficulty urinating.   Musculoskeletal:  Negative for arthralgias.  Skin:  Negative for itching and rash.  Neurological:  Negative for  dizziness, extremity weakness, headaches and numbness.  Hematological:  Negative for adenopathy. Does not bruise/bleed easily.  Psychiatric/Behavioral:  Negative for depression. The patient is not nervous/anxious.       PHYSICAL EXAMINATION  Patient sounds well.  She is in no apparent distress, mood and behavior are normal, speech is normal.       ASSESSMENT and THERAPY PLAN:   Osteoporosis She has osteoporosis noted on bone density testing from August 22, 2022.    I counseled her on calcium and vitamin D intake.  She has limited abilities for weightbearing exercises.  I recommended that she proceed with bisphosphonate therapy with Fosamax weekly.  We reviewed the risks and benefits in detail.  She tells me she is going to take calcium on a more regular basis.  We reviewed that her dentist needs to know that she is taking the Fosamax and that she should avoid extractions or implants while she is taking it and that the medication would need to be held if she needs those.   When Edris returns in November we will check her labs and that will include a repeat calcium.  Malignant neoplasm of lower-outer quadrant of left breast of female, estrogen receptor positive (HCC) Modean is an 81 year old woman with history of stage Ia ER/PR positive left-sided breast cancer diagnosed in July 2023 status post left lumpectomy followed by antiestrogen therapy with letrozole in August 2023.  Left breast cancer: She continues on letrozole daily with good tolerance.  She will undergo bilateral breast mammogram tomorrow. Bone health: Because of the impact letrozole can has on her bones we have recommended Fosamax for her bone density.   Amare will return in November 2024 for labs, follow-up.  She knows to call for any questions or concerns that may arise between now and her next appointment as we can always see her sooner if needed.     Follow up instructions:    -Return to cancer center 12/2022   -Mammogram due in 08/2022 -Repeat bone density in 08/2024   The patient was provided an opportunity to ask questions and all were answered. The patient agreed with the plan and demonstrated an understanding of the instructions.   The patient was advised to call back or seek an in-person evaluation if the symptoms worsen or if the condition fails to improve as anticipated.   I provided 15 minutes of non face-to-face telephone visit time during this encounter, and > 50% was spent counseling as documented under my  assessment & plan.  Lillard Anes, NP 09/03/22 12:14 PM Medical Oncology and Hematology [facility] [address] [place], [place] [address] Tel. [phone]    Fax. [phone]

## 2022-09-03 NOTE — Assessment & Plan Note (Signed)
She has osteoporosis noted on bone density testing from August 22, 2022.    I counseled her on calcium and vitamin D intake.  She has limited abilities for weightbearing exercises.  I recommended that she proceed with bisphosphonate therapy with Fosamax weekly.  We reviewed the risks and benefits in detail.  She tells me she is going to take calcium on a more regular basis.  We reviewed that her dentist needs to know that she is taking the Fosamax and that she should avoid extractions or implants while she is taking it and that the medication would need to be held if she needs those.   When Christine Harrell returns in November we will check her labs and that will include a repeat calcium.

## 2022-09-03 NOTE — Telephone Encounter (Signed)
Scheduled appointment per 7/22 los. Patient is aware of the made appointment.

## 2022-09-03 NOTE — Assessment & Plan Note (Signed)
Christine Harrell is an 82 year old woman with history of stage Ia ER/PR positive left-sided breast cancer diagnosed in July 2023 status post left lumpectomy followed by antiestrogen therapy with letrozole in August 2023.  Left breast cancer: She continues on letrozole daily with good tolerance.  She will undergo bilateral breast mammogram tomorrow. Bone health: Because of the impact letrozole can has on her bones we have recommended Fosamax for her bone density.   Christine Harrell will return in November 2024 for labs, follow-up.  She knows to call for any questions or concerns that may arise between now and her next appointment as we can always see her sooner if needed.

## 2022-09-04 ENCOUNTER — Ambulatory Visit
Admission: RE | Admit: 2022-09-04 | Discharge: 2022-09-04 | Disposition: A | Discharge status: Home / Self Care | Payer: Medicare PPO | Source: Ambulatory Visit | Attending: Obstetrics and Gynecology | Admitting: Obstetrics and Gynecology

## 2022-09-04 DIAGNOSIS — Z1231 Encounter for screening mammogram for malignant neoplasm of breast: Secondary | ICD-10-CM

## 2022-12-24 ENCOUNTER — Inpatient Hospital Stay: Payer: Medicare PPO

## 2022-12-24 ENCOUNTER — Encounter: Payer: Self-pay | Admitting: Adult Health

## 2022-12-24 ENCOUNTER — Inpatient Hospital Stay: Payer: Medicare PPO | Attending: Adult Health | Admitting: Adult Health

## 2022-12-24 VITALS — BP 134/62 | HR 50 | Temp 97.8°F | Resp 17 | Wt 185.7 lb

## 2022-12-24 DIAGNOSIS — Z79811 Long term (current) use of aromatase inhibitors: Secondary | ICD-10-CM | POA: Insufficient documentation

## 2022-12-24 DIAGNOSIS — Z8041 Family history of malignant neoplasm of ovary: Secondary | ICD-10-CM | POA: Insufficient documentation

## 2022-12-24 DIAGNOSIS — C50512 Malignant neoplasm of lower-outer quadrant of left female breast: Secondary | ICD-10-CM | POA: Insufficient documentation

## 2022-12-24 DIAGNOSIS — R232 Flushing: Secondary | ICD-10-CM | POA: Diagnosis not present

## 2022-12-24 DIAGNOSIS — Z803 Family history of malignant neoplasm of breast: Secondary | ICD-10-CM | POA: Diagnosis not present

## 2022-12-24 DIAGNOSIS — Z170 Estrogen receptor positive status [ER+]: Secondary | ICD-10-CM | POA: Insufficient documentation

## 2022-12-24 DIAGNOSIS — M810 Age-related osteoporosis without current pathological fracture: Secondary | ICD-10-CM | POA: Diagnosis not present

## 2022-12-24 DIAGNOSIS — Z800 Family history of malignant neoplasm of digestive organs: Secondary | ICD-10-CM | POA: Diagnosis not present

## 2022-12-24 DIAGNOSIS — M816 Localized osteoporosis [Lequesne]: Secondary | ICD-10-CM

## 2022-12-24 LAB — CMP (CANCER CENTER ONLY)
ALT: 12 U/L (ref 0–44)
AST: 18 U/L (ref 15–41)
Albumin: 4 g/dL (ref 3.5–5.0)
Alkaline Phosphatase: 74 U/L (ref 38–126)
Anion gap: 5 (ref 5–15)
BUN: 19 mg/dL (ref 8–23)
CO2: 29 mmol/L (ref 22–32)
Calcium: 9.8 mg/dL (ref 8.9–10.3)
Chloride: 105 mmol/L (ref 98–111)
Creatinine: 0.89 mg/dL (ref 0.44–1.00)
GFR, Estimated: >60 mL/min (ref >60)
Glucose, Bld: 99 mg/dL (ref 70–99)
Potassium: 5 mmol/L (ref 3.5–5.1)
Sodium: 139 mmol/L (ref 135–145)
Total Bilirubin: 0.7 mg/dL (ref <1.2)
Total Protein: 6.9 g/dL (ref 6.5–8.1)

## 2022-12-24 LAB — CBC WITH DIFFERENTIAL (CANCER CENTER ONLY)
Abs Immature Granulocytes: 0.02 10*3/uL (ref 0.00–0.07)
Basophils Absolute: 0 10*3/uL (ref 0.0–0.1)
Basophils Relative: 1 %
Eosinophils Absolute: 0.2 10*3/uL (ref 0.0–0.5)
Eosinophils Relative: 3 %
HCT: 46.4 % — ABNORMAL HIGH (ref 36.0–46.0)
Hemoglobin: 15.5 g/dL — ABNORMAL HIGH (ref 12.0–15.0)
Immature Granulocytes: 0 %
Lymphocytes Relative: 23 %
Lymphs Abs: 1.5 10*3/uL (ref 0.7–4.0)
MCH: 30.9 pg (ref 26.0–34.0)
MCHC: 33.4 g/dL (ref 30.0–36.0)
MCV: 92.6 fL (ref 80.0–100.0)
Monocytes Absolute: 0.6 10*3/uL (ref 0.1–1.0)
Monocytes Relative: 9 %
Neutro Abs: 4.4 10*3/uL (ref 1.7–7.7)
Neutrophils Relative %: 64 %
Platelet Count: 257 10*3/uL (ref 150–400)
RBC: 5.01 MIL/uL (ref 3.87–5.11)
RDW: 13 % (ref 11.5–15.5)
WBC Count: 6.8 10*3/uL (ref 4.0–10.5)
nRBC: 0 % (ref 0.0–0.2)

## 2022-12-24 NOTE — Assessment & Plan Note (Signed)
Christine Harrell is an 82 year old woman with history of stage Ia ER/PR positive left-sided breast cancer diagnosed in July 2023 status post left lumpectomy followed by antiestrogen therapy with letrozole in August 2023.  Breast Cancer No new breast changes or symptoms. No concerns on physical examination. -Continue Letrozole daily.  -Next mammogram due in July.  Hot Flashes Nightly hot flashes, occasionally waking patient from sleep. Patient is currently taking Letrozole. -Consider changing the time of day that Letrozole is taken if sleep quality continues to be impacted.  Osteoporosis Patient is currently taking Fosamax. Recent labs show good calcium levels and kidney function. -Continue Fosamax weekly. -Next bone density test due in July 2026.  General Health Maintenance / Followup Plans -RTC to see Dr. Pamelia Hoit in 06/2023

## 2022-12-24 NOTE — Progress Notes (Signed)
[facility] Cancer Follow up:    Corrington, [name], MD [address] [place] [address]   DIAGNOSIS:  Cancer Staging  Malignant neoplasm of lower-outer quadrant of left breast of female, estrogen receptor positive (HCC) Staging form: Breast, AJCC 8th Edition - Clinical: Stage IA (cT1b, cN0, cM0, G2, ER+, PR+, HER2-) - Signed by Serena Croissant, MD on 08/23/2021 Stage prefix: Initial diagnosis Histologic grading system: 3 grade system   SUMMARY OF ONCOLOGIC HISTORY: Oncology History  Malignant neoplasm of lower-outer quadrant of left breast of female, estrogen receptor positive (HCC)  08/14/2021 Initial Diagnosis   Screening detected left breast mass at 6 o'clock position measuring 0.7 cm, axilla negative, biopsy revealed grade 2 IDC with lobular features with ADH ER 100%, PR 95%, Ki-67 10%, HER2 equivocal by IHC FISH negative   08/23/2021 Cancer Staging   Staging form: Breast, AJCC 8th Edition - Clinical: Stage IA (cT1b, cN0, cM0, G2, ER+, PR+, HER2-) - Signed by Serena Croissant, MD on 08/23/2021 Stage prefix: Initial diagnosis Histologic grading system: 3 grade system   09/04/2021 Surgery   Left lumpectomy: Grade 2 IDC with lobular features, intermediate grade DCIS, 0.9 cm, margins negative, ER 100%, PR 95%, HER2 negative, Ki-67 10%    Genetic Testing   [employer] CustomNext Panel was Negative. Report date is 09/18/2021.  The CustomNext-Cancer+RNAinsight panel offered by [employer]es includes sequencing and rearrangement analysis for the following 47 genes:  APC, ATM, AXIN2, BARD1, BMPR1A, BRCA1, BRCA2, BRIP1, CDH1, CDK4, CDKN2A, CHEK2, CTNNA1, DICER1, EPCAM, GREM1, HOXB13, KIT, MEN1, MLH1, MSH2, MSH3, MSH6, MUTYH, NBN, NF1, NTHL1, PALB2, PDGFRA, PMS2, POLD1, POLE, PTEN, RAD50, RAD51C, RAD51D, SDHA, SDHB, SDHC, SDHD, SMAD4, SMARCA4, STK11, TP53, TSC1, TSC2, and VHL.  RNA data is routinely analyzed for use in variant interpretation for all genes.   09/2021 -  Anti-estrogen  oral therapy   2.5 mg Letrozole x 5 years     CURRENT THERAPY: Letrozole  INTERVAL HISTORY:  Discussed the use of AI scribe software for clinical note transcription with the patient, who gave verbal consent to proceed.  Christine Harrell 82 y.o. female  breast cancer, osteoporosis, and menopausal symptoms, has been on Letrozole and Fosamax. She reports experiencing hot flashes, primarily late at night, which occasionally wake her up due to sweating. Despite this, she is able to return to sleep. She has not reported any issues with Fosamax, although she recently forgot to take it on the scheduled day and decided to shift the medication day to better remember. She has not reported any new symptoms or issues with her current medication regimen.   Her 08/2022 mammogram was negative for malignancy.    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  MEDICAL HISTORY: Past Medical History:  Diagnosis Date   Appendicitis, acute 11/21/2010   Hypertension    Hypothyroidism  SURGICAL HISTORY: Past Surgical History:  Procedure Laterality Date   APPENDECTOMY     BACK SURGERY     BREAST LUMPECTOMY Left 09/04/2021   BREAST LUMPECTOMY WITH RADIOACTIVE SEED LOCALIZATION Left 09/04/2021   Procedure: LEFT BREAST RADIOACTIVE SEED  LOCALIZED LUMPECTOMY;  Surgeon: Griselda Miner, MD;  Location: [facility];  Service: General;  Laterality: Left;   CATARACT EXTRACTION     TONSILECTOMY, ADENOIDECTOMY, BILATERAL MYRINGOTOMY AND TUBES      SOCIAL HISTORY: Social History   Socioeconomic History   Marital status: Single    Spouse name: Not on file   Number of children: Not on file   Years of education: Not on file   Highest education level: Not on file  Occupational History   Not on file  Tobacco Use   Smoking status: Never   Smokeless tobacco: Never  Vaping Use   Vaping status: Never Used  Substance and Sexual Activity   Alcohol use: Not Currently   Drug use: Never   Sexual activity: Not on file  Other Topics Concern   Not on file  Social History Narrative   Not on file   Social Determinants of Health   Financial Resource Strain: Low Risk  (08/10/2022)   Received from [facility]   Overall Financial Resource Strain (CARDIA)    Difficulty of Paying Living Expenses: Not hard at all  Food Insecurity: No Food Insecurity (08/10/2022)   Received from [facility]   Hunger Vital Sign    Worried About Running Out of Food in the Last Year: Never true    Ran Out of Food in the Last Year: Never true  Transportation Needs: No Transportation Needs (08/10/2022)   Received from [facility] - Transportation    Lack of Transportation (Medical): No    Lack of Transportation (Non-Medical): No  Physical Activity: Unknown (08/10/2022)   Received from [facility]   Exercise Vital Sign    Days of Exercise per Week: 0 days    Minutes of Exercise per Session: Not on file  Stress: No Stress Concern Present (08/10/2022)   Received from [facility] - Occupational Stress Questionnaire    Feeling of Stress : Only a little  Social Connections: Moderately Integrated (08/10/2022)   Received from [facility]   Social Network    How would you rate your  social network (family, work, friends)?: Adequate participation with social networks  Intimate Partner Violence: Not At Risk (08/10/2022)   Received from [facility]   HITS    Over the last 12 months how often did your partner physically hurt you?: Never    Over the last 12 months how often did your partner insult you or talk down to you?: Never    Over the last 12 months how often did your partner threaten you with physical harm?: Never    Over the last 12 months how often did your partner scream or curse at you?: Never    FAMILY HISTORY: Family History  Problem Relation Age of Onset   Breast cancer Mother 31   Colon cancer Mother 61   Ovarian cancer Mother 78   Pancreatic cancer Father 13   Breast cancer Niece 57 - 58    Review of Systems  Constitutional:  Negative for appetite change, chills, fatigue, fever and unexpected weight change.  HENT:   Negative for hearing loss, lump/mass and trouble swallowing.   Eyes:  Negative  for eye problems and icterus.  Respiratory:  Negative for chest tightness, cough and shortness of breath.   Cardiovascular:  Negative for chest pain, leg swelling and palpitations.  Gastrointestinal:  Negative for abdominal distention, abdominal pain, constipation, diarrhea, nausea and vomiting.  Endocrine: Negative for hot flashes.  Genitourinary:  Negative for difficulty urinating.   Musculoskeletal:  Negative for arthralgias.  Skin:  Negative for itching and rash.  Neurological:  Negative for dizziness, extremity weakness, headaches and numbness.  Hematological:  Negative for adenopathy. Does not bruise/bleed easily.  Psychiatric/Behavioral:  Negative for depression. The patient is not nervous/anxious.       PHYSICAL EXAMINATION    Vitals:   12/24/22 1107 12/24/22 1145  BP: (!) 155/60 134/62  Pulse: (!) 59 (!) 50  Resp: 17   Temp: 97.8 F (36.6 C)   SpO2: 98%     Physical Exam Constitutional:      General: She is not in acute distress.     Appearance: Normal appearance. She is not toxic-appearing.  HENT:     Head: Normocephalic and atraumatic.     Mouth/Throat:     Mouth: Mucous membranes are moist.     Pharynx: Oropharynx is clear. No oropharyngeal exudate or posterior oropharyngeal erythema.  Eyes:     General: No scleral icterus. Cardiovascular:     Rate and Rhythm: Normal rate and regular rhythm.     Pulses: Normal pulses.     Heart sounds: Normal heart sounds.  Pulmonary:     Effort: Pulmonary effort is normal.     Breath sounds: Normal breath sounds.  Chest:     Comments: Left breast s/p lumpectomy, no sign of local recurrence, right breast benign Abdominal:     General: Abdomen is flat. Bowel sounds are normal. There is no distension.     Palpations: Abdomen is soft.     Tenderness: There is no abdominal tenderness.  Musculoskeletal:        General: No swelling.     Cervical back: Neck supple.  Lymphadenopathy:     Cervical: No cervical adenopathy.     Upper Body:     Right upper body: No axillary adenopathy.     Left upper body: No axillary adenopathy.  Skin:    General: Skin is warm and dry.     Findings: No rash.  Neurological:     General: No focal deficit present.     Mental Status: She is alert.  Psychiatric:        Mood and Affect: Mood normal.        Behavior: Behavior normal.     LABORATORY DATA:  CBC    Component Value Date/Time   WBC 6.8 12/24/2022 1041   WBC 7.1 11/18/2007 1405   RBC 5.01 12/24/2022 1041   HGB 15.5 (H) 12/24/2022 1041   HCT 46.4 (H) 12/24/2022 1041   PLT 257 12/24/2022 1041   MCV 92.6 12/24/2022 1041   MCH 30.9 12/24/2022 1041   MCHC 33.4 12/24/2022 1041   RDW 13.0 12/24/2022 1041   LYMPHSABS 1.5 12/24/2022 1041   MONOABS 0.6 12/24/2022 1041   EOSABS 0.2 12/24/2022 1041   BASOSABS 0.0 12/24/2022 1041    CMP     Component Value Date/Time   NA 139 12/24/2022 1041   K 5.0 12/24/2022 1041   CL 105 12/24/2022 1041   CO2 29 12/24/2022 1041   GLUCOSE 99  12/24/2022 1041   BUN 19 12/24/2022 1041   CREATININE 0.89 12/24/2022 1041  CALCIUM 9.8 12/24/2022 1041   PROT 6.9 12/24/2022 1041   ALBUMIN 4.0 12/24/2022 1041   AST 18 12/24/2022 1041   ALT 12 12/24/2022 1041   ALKPHOS 74 12/24/2022 1041   BILITOT 0.7 12/24/2022 1041   GFRNONAA >60 12/24/2022 1041   GFRAA  11/18/2007 1405    >60        The eGFR has been calculated using the MDRD equation. This calculation has not been validated in all clinical        ASSESSMENT and THERAPY PLAN:   Malignant neoplasm of lower-outer quadrant of left breast of female, estrogen receptor positive (HCC) Christine Harrell is an 82 year old woman with history of stage Ia ER/PR positive left-sided breast cancer diagnosed in July 2023 status post left lumpectomy followed by antiestrogen therapy with letrozole in August 2023.  Breast Cancer No new breast changes or symptoms. No concerns on physical examination. -Continue Letrozole daily.  -Next mammogram due in July.  Hot Flashes Nightly hot flashes, occasionally waking patient from sleep. Patient is currently taking Letrozole. -Consider changing the time of day that Letrozole is taken if sleep quality continues to be impacted.  Osteoporosis Patient is currently taking Fosamax. Recent labs show good calcium levels and kidney function. -Continue Fosamax weekly. -Next bone density test due in July 2026.  General Health Maintenance / Followup Plans -RTC to see Dr. Pamelia Hoit in 06/2023    All questions were answered. The patient knows to call the clinic with any problems, questions or concerns. We can certainly see the patient much sooner if necessary.  Total encounter time:20 minutes*in face-to-face visit time, chart review, lab review, care coordination, order entry, and documentation of the encounter time.    Lillard Anes, NP 12/24/22 1:42 PM Medical Oncology and Hematology [facility] [address] [place], [place] [address] Tel.  [phone]    Fax. [phone]  *Total Encounter Time as defined by the Centers for Medicare and Medicaid Services includes, in addition to the face-to-face time of a patient visit (documented in the note above) non-face-to-face time: obtaining and reviewing outside history, ordering and reviewing medications, tests or procedures, care coordination (communications with other health care professionals or caregivers) and documentation in the medical record.

## 2023-01-28 ENCOUNTER — Other Ambulatory Visit: Payer: Self-pay | Admitting: Adult Health

## 2023-01-28 DIAGNOSIS — M816 Localized osteoporosis [Lequesne]: Secondary | ICD-10-CM

## 2023-01-28 DIAGNOSIS — C50512 Malignant neoplasm of lower-outer quadrant of left female breast: Secondary | ICD-10-CM

## 2023-06-05 ENCOUNTER — Other Ambulatory Visit: Payer: Self-pay | Admitting: Hematology and Oncology

## 2023-06-24 ENCOUNTER — Inpatient Hospital Stay: Payer: Medicare PPO | Attending: Hematology and Oncology | Admitting: Hematology and Oncology

## 2023-06-24 VITALS — BP 179/61 | HR 60 | Temp 98.0°F | Resp 18 | Ht 62.0 in | Wt 185.6 lb

## 2023-06-24 DIAGNOSIS — Z8041 Family history of malignant neoplasm of ovary: Secondary | ICD-10-CM | POA: Diagnosis not present

## 2023-06-24 DIAGNOSIS — Z803 Family history of malignant neoplasm of breast: Secondary | ICD-10-CM | POA: Insufficient documentation

## 2023-06-24 DIAGNOSIS — C50512 Malignant neoplasm of lower-outer quadrant of left female breast: Secondary | ICD-10-CM | POA: Diagnosis present

## 2023-06-24 DIAGNOSIS — M810 Age-related osteoporosis without current pathological fracture: Secondary | ICD-10-CM | POA: Insufficient documentation

## 2023-06-24 DIAGNOSIS — Z79811 Long term (current) use of aromatase inhibitors: Secondary | ICD-10-CM | POA: Diagnosis not present

## 2023-06-24 DIAGNOSIS — Z170 Estrogen receptor positive status [ER+]: Secondary | ICD-10-CM | POA: Insufficient documentation

## 2023-06-24 DIAGNOSIS — R232 Flushing: Secondary | ICD-10-CM | POA: Diagnosis not present

## 2023-06-24 DIAGNOSIS — Z800 Family history of malignant neoplasm of digestive organs: Secondary | ICD-10-CM | POA: Insufficient documentation

## 2023-06-24 NOTE — Assessment & Plan Note (Signed)
 09/04/2021:Left lumpectomy: Grade 2 IDC with lobular features, intermediate grade DCIS, 0.9 cm, margins negative, ER 100%, PR 95%, HER2 negative, Ki-67 10%  Treatment plan: 1.  Did not do radiation 2.  Antiestrogen therapy with letrozole  2.5 mg daily x5 years started 09/15/2021   Letrozole  toxicities: Occasional hot flashes but otherwise tolerating it very well.   Breast cancer surveillance: Breast exam 06/24/2023: Benign Mammogram 09/04/2022: Benign breast density category B.   Return to clinic in 1 year for follow-up

## 2023-06-24 NOTE — Progress Notes (Signed)
 Patient Care Team: Corrington, Irma Mans, MD as PCP - General (Family Medicine) Caralyn Chandler, MD as Consulting Physician (General Surgery) Cameron Cea, MD as Consulting Physician (Hematology and Oncology) Retta Caster, MD as Consulting Physician (Radiation Oncology)  DIAGNOSIS:  Encounter Diagnosis  Name Primary?   Malignant neoplasm of lower-outer quadrant of left breast of female, estrogen receptor positive (HCC) Yes    SUMMARY OF ONCOLOGIC HISTORY: Oncology History  Malignant neoplasm of lower-outer quadrant of left breast of female, estrogen receptor positive (HCC)  08/14/2021 Initial Diagnosis   Screening detected left breast mass at 6 o'clock position measuring 0.7 cm, axilla negative, biopsy revealed grade 2 IDC with lobular features with ADH ER 100%, PR 95%, Ki-67 10%, HER2 equivocal by IHC FISH negative   08/23/2021 Cancer Staging   Staging form: Breast, AJCC 8th Edition - Clinical: Stage IA (cT1b, cN0, cM0, G2, ER+, PR+, HER2-) - Signed by Cameron Cea, MD on 08/23/2021 Stage prefix: Initial diagnosis Histologic grading system: 3 grade system   09/04/2021 Surgery   Left lumpectomy: Grade 2 IDC with lobular features, intermediate grade DCIS, 0.9 cm, margins negative, ER 100%, PR 95%, HER2 negative, Ki-67 10%    Genetic Testing   [facility] CustomNext Panel was Negative. Report date is 09/18/2021.  The CustomNext-Cancer+RNAinsight panel offered by [employer] Real includes sequencing and rearrangement analysis for the following 47 genes:  APC, ATM, AXIN2, BARD1, BMPR1A, BRCA1, BRCA2, BRIP1, CDH1, CDK4, CDKN2A, CHEK2, CTNNA1, DICER1, EPCAM, GREM1, HOXB13, KIT, MEN1, MLH1, MSH2, MSH3, MSH6, MUTYH, NBN, NF1, NTHL1, PALB2, PDGFRA, PMS2, POLD1, POLE, PTEN, RAD50, RAD51C, RAD51D, SDHA, SDHB, SDHC, SDHD, SMAD4, SMARCA4, STK11, TP53, TSC1, TSC2, and VHL.  RNA data is routinely analyzed for use in variant interpretation for all genes.   09/2021 -  Anti-estrogen oral therapy   2.5 mg Letrozole   x 5 years     CHIEF COMPLIANT: Follow-up on letrozole  therapy  HISTORY OF PRESENT ILLNESS: History of Present Illness Christine Harrell is an 83 year old female with breast cancer who presents for a follow-up visit.  She experiences hot flashes almost every night as a side effect of letrozole , which she takes around lunchtime. These hot flashes often occur before bed and sometimes wake her during the night.  Her last mammogram was in July of the previous year, with plans for the next one in July of this year at her preferred office.  Her bone density was last checked in 2024, and she intends to have another test in July of this year, coordinating it with her mammogram appointment.     ALLERGIES:  has no known allergies.  MEDICATIONS:  Current Outpatient Medications  Medication Sig Dispense Refill   alendronate  (FOSAMAX ) 70 MG tablet Take 1 tablet (70 mg total) by mouth once a week. Take with a full glass of water on an empty stomach. 12 tablet 2   amLODipine (NORVASC) 5 MG tablet Take 5 mg by mouth daily.     Calcium Carb-Cholecalciferol (CALCIUM 600 + D PO) Take 1 tablet by mouth daily.     cholecalciferol (VITAMIN D3) 25 MCG (1000 UNIT) tablet Take 1,000 Units by mouth daily.     citalopram (CELEXA) 20 MG tablet Take 20 mg by mouth every morning.     letrozole  (FEMARA ) 2.5 MG tablet TAKE ONE TABLET BY MOUTH EVERY DAY 90 tablet 3   levothyroxine (SYNTHROID) 75 MCG tablet Take 75 mcg by mouth daily.     pravastatin (PRAVACHOL) 40 MG tablet Take 40 mg  by mouth daily.     solifenacin (VESICARE) 5 MG tablet Take 5 mg by mouth at bedtime.     No current facility-administered medications for this visit.    PHYSICAL EXAMINATION: ECOG PERFORMANCE STATUS: 1 - Symptomatic but completely ambulatory  Vitals:   06/24/23 1203  BP: (!) 179/61  Pulse: 60  Resp: 18  Temp: 98 F (36.7 C)  SpO2: 96%   Filed Weights   06/24/23 1203  Weight: 185 lb 9.6 oz (84.2 kg)    Physical Exam No  palpable lumps or nodules in bilateral breasts or axilla  (exam performed in the presence of a chaperone)  LABORATORY DATA:  I have reviewed the data as listed    Latest Ref Rng & Units 12/24/2022   10:41 AM 08/23/2021   12:25 PM 11/18/2007    2:05 PM  CMP  Glucose 70 - 99 mg/dL 99  440  76   BUN 8 - 23 mg/dL 19  27  16    Creatinine 0.44 - 1.00 mg/dL 3.47  4.25  9.56   Sodium 135 - 145 mmol/L 139  140  141   Potassium 3.5 - 5.1 mmol/L 5.0  4.4  3.9   Chloride 98 - 111 mmol/L 105  105  105   CO2 22 - 32 mmol/L 29  30  28    Calcium 8.9 - 10.3 mg/dL 9.8  9.6  8.9   Total Protein 6.5 - 8.1 g/dL 6.9  7.0    Total Bilirubin <1.2 mg/dL 0.7  0.5    Alkaline Phos 38 - 126 U/L 74  78    AST 15 - 41 U/L 18  15    ALT 0 - 44 U/L 12  14      Lab Results  Component Value Date   WBC 6.8 12/24/2022   HGB 15.5 (H) 12/24/2022   HCT 46.4 (H) 12/24/2022   MCV 92.6 12/24/2022   PLT 257 12/24/2022   NEUTROABS 4.4 12/24/2022    ASSESSMENT & PLAN:  Malignant neoplasm of lower-outer quadrant of left breast of female, estrogen receptor positive (HCC) 09/04/2021:Left lumpectomy: Grade 2 IDC with lobular features, intermediate grade DCIS, 0.9 cm, margins negative, ER 100%, PR 95%, HER2 negative, Ki-67 10%  Treatment plan: 1.  Did not do radiation 2.  Antiestrogen therapy with letrozole  2.5 mg daily x5 years started 09/15/2021   Letrozole  toxicities: Occasional hot flashes but otherwise tolerating it very well.   Breast cancer surveillance: Breast exam 06/24/2023: Benign Mammogram 09/04/2022: Benign breast density category B.   Return to clinic in 1 year for follow-up    No orders of the defined types were placed in this encounter.  The patient has a good understanding of the overall plan. she agrees with it. she will call with any problems that may develop before the next visit here. Total time spent: 30 mins including face to face time and time spent for planning, charting and co-ordination of  care   Viinay K Jakory Matsuo, MD 06/24/23

## 2023-06-26 ENCOUNTER — Other Ambulatory Visit: Payer: Self-pay | Admitting: Obstetrics and Gynecology

## 2023-06-26 DIAGNOSIS — Z853 Personal history of malignant neoplasm of breast: Secondary | ICD-10-CM

## 2023-08-12 ENCOUNTER — Encounter

## 2023-09-05 ENCOUNTER — Encounter

## 2023-09-05 ENCOUNTER — Ambulatory Visit
Admission: RE | Admit: 2023-09-05 | Discharge: 2023-09-05 | Disposition: A | Discharge status: Home / Self Care | Source: Ambulatory Visit | Attending: Obstetrics and Gynecology | Admitting: Obstetrics and Gynecology

## 2023-09-05 DIAGNOSIS — Z853 Personal history of malignant neoplasm of breast: Secondary | ICD-10-CM

## 2023-10-30 ENCOUNTER — Other Ambulatory Visit: Payer: Self-pay | Admitting: Adult Health

## 2023-10-30 DIAGNOSIS — C50512 Malignant neoplasm of lower-outer quadrant of left female breast: Secondary | ICD-10-CM

## 2023-10-30 DIAGNOSIS — M816 Localized osteoporosis [Lequesne]: Secondary | ICD-10-CM

## 2024-06-23 ENCOUNTER — Inpatient Hospital Stay: Admitting: Hematology and Oncology
